# Patient Record
Sex: Female | Born: 1960 | Race: White | Hispanic: No | Marital: Married | Smoking: Former smoker
Health system: Southern US, Community
[De-identification: ages and names within clinical notes are randomized; demographics above are authoritative.]

## PROBLEM LIST (undated history)

## (undated) DIAGNOSIS — N812 Incomplete uterovaginal prolapse: Secondary | ICD-10-CM

## (undated) DIAGNOSIS — K449 Diaphragmatic hernia without obstruction or gangrene: Secondary | ICD-10-CM

## (undated) DIAGNOSIS — J449 Chronic obstructive pulmonary disease, unspecified: Secondary | ICD-10-CM

## (undated) DIAGNOSIS — G43909 Migraine, unspecified, not intractable, without status migrainosus: Secondary | ICD-10-CM

## (undated) DIAGNOSIS — Z973 Presence of spectacles and contact lenses: Secondary | ICD-10-CM

## (undated) DIAGNOSIS — J439 Emphysema, unspecified: Secondary | ICD-10-CM

## (undated) DIAGNOSIS — R3915 Urgency of urination: Secondary | ICD-10-CM

## (undated) DIAGNOSIS — R102 Pelvic and perineal pain: Secondary | ICD-10-CM

## (undated) DIAGNOSIS — R3989 Other symptoms and signs involving the genitourinary system: Secondary | ICD-10-CM

## (undated) DIAGNOSIS — Z87442 Personal history of urinary calculi: Secondary | ICD-10-CM

## (undated) DIAGNOSIS — N393 Stress incontinence (female) (male): Secondary | ICD-10-CM

## (undated) DIAGNOSIS — R350 Frequency of micturition: Secondary | ICD-10-CM

## (undated) DIAGNOSIS — K219 Gastro-esophageal reflux disease without esophagitis: Secondary | ICD-10-CM

## (undated) HISTORY — PX: OTHER SURGICAL HISTORY: SHX169

## (undated) HISTORY — DX: Chronic obstructive pulmonary disease, unspecified: J44.9

---

## 1991-12-09 HISTORY — PX: TUBAL LIGATION: SHX77

## 2002-08-10 ENCOUNTER — Ambulatory Visit (HOSPITAL_COMMUNITY): Admission: RE | Admit: 2002-08-10 | Discharge: 2002-08-10 | Payer: Self-pay | Admitting: Family Medicine

## 2002-08-10 ENCOUNTER — Encounter: Payer: Self-pay | Admitting: Family Medicine

## 2007-02-09 ENCOUNTER — Other Ambulatory Visit: Admission: RE | Admit: 2007-02-09 | Discharge: 2007-02-09 | Payer: Self-pay | Admitting: Family Medicine

## 2008-02-16 ENCOUNTER — Other Ambulatory Visit: Admission: RE | Admit: 2008-02-16 | Discharge: 2008-02-16 | Payer: Self-pay | Admitting: Family Medicine

## 2009-12-11 ENCOUNTER — Other Ambulatory Visit: Admission: RE | Admit: 2009-12-11 | Discharge: 2009-12-11 | Payer: Self-pay | Admitting: Family Medicine

## 2011-02-17 ENCOUNTER — Other Ambulatory Visit (HOSPITAL_COMMUNITY): Payer: Self-pay | Admitting: Family Medicine

## 2011-02-17 DIAGNOSIS — Z1231 Encounter for screening mammogram for malignant neoplasm of breast: Secondary | ICD-10-CM

## 2011-02-27 ENCOUNTER — Ambulatory Visit (HOSPITAL_COMMUNITY)
Admission: RE | Admit: 2011-02-27 | Discharge: 2011-02-27 | Disposition: A | Discharge status: Home / Self Care | Payer: BC Managed Care – PPO | Source: Ambulatory Visit | Attending: Family Medicine | Admitting: Family Medicine

## 2011-02-27 DIAGNOSIS — Z1231 Encounter for screening mammogram for malignant neoplasm of breast: Secondary | ICD-10-CM | POA: Insufficient documentation

## 2011-09-19 ENCOUNTER — Other Ambulatory Visit (HOSPITAL_COMMUNITY)
Admission: RE | Admit: 2011-09-19 | Discharge: 2011-09-19 | Disposition: A | Discharge status: Home / Self Care | Payer: BC Managed Care – PPO | Source: Ambulatory Visit | Attending: Family Medicine | Admitting: Family Medicine

## 2011-09-19 ENCOUNTER — Other Ambulatory Visit: Payer: Self-pay | Admitting: Family Medicine

## 2011-09-19 DIAGNOSIS — Z124 Encounter for screening for malignant neoplasm of cervix: Secondary | ICD-10-CM | POA: Insufficient documentation

## 2012-07-01 ENCOUNTER — Other Ambulatory Visit (HOSPITAL_COMMUNITY): Payer: Self-pay | Admitting: Family Medicine

## 2012-07-01 DIAGNOSIS — Z1231 Encounter for screening mammogram for malignant neoplasm of breast: Secondary | ICD-10-CM

## 2012-07-22 ENCOUNTER — Ambulatory Visit (HOSPITAL_COMMUNITY)
Admission: RE | Admit: 2012-07-22 | Discharge: 2012-07-22 | Disposition: A | Discharge status: Home / Self Care | Payer: BC Managed Care – PPO | Source: Ambulatory Visit | Attending: Family Medicine | Admitting: Family Medicine

## 2012-07-22 DIAGNOSIS — Z1231 Encounter for screening mammogram for malignant neoplasm of breast: Secondary | ICD-10-CM | POA: Insufficient documentation

## 2013-07-11 ENCOUNTER — Other Ambulatory Visit (HOSPITAL_COMMUNITY): Payer: Self-pay | Admitting: Family Medicine

## 2013-07-11 DIAGNOSIS — Z1231 Encounter for screening mammogram for malignant neoplasm of breast: Secondary | ICD-10-CM

## 2013-07-27 ENCOUNTER — Ambulatory Visit (HOSPITAL_COMMUNITY)
Admission: RE | Admit: 2013-07-27 | Discharge: 2013-07-27 | Disposition: A | Discharge status: Home / Self Care | Payer: 59 | Source: Ambulatory Visit | Attending: Family Medicine | Admitting: Family Medicine

## 2013-07-27 DIAGNOSIS — Z1231 Encounter for screening mammogram for malignant neoplasm of breast: Secondary | ICD-10-CM | POA: Insufficient documentation

## 2013-12-23 ENCOUNTER — Ambulatory Visit: Payer: Self-pay | Admitting: Women's Health

## 2014-03-08 DIAGNOSIS — Z9889 Other specified postprocedural states: Secondary | ICD-10-CM

## 2014-03-08 HISTORY — DX: Other specified postprocedural states: Z98.890

## 2014-03-16 ENCOUNTER — Ambulatory Visit (INDEPENDENT_AMBULATORY_CARE_PROVIDER_SITE_OTHER): Payer: 59 | Admitting: Women's Health

## 2014-03-16 ENCOUNTER — Encounter: Payer: Self-pay | Admitting: Women's Health

## 2014-03-16 ENCOUNTER — Telehealth: Payer: Self-pay

## 2014-03-16 VITALS — BP 108/72 | Ht 66.0 in | Wt 152.6 lb

## 2014-03-16 DIAGNOSIS — N952 Postmenopausal atrophic vaginitis: Secondary | ICD-10-CM

## 2014-03-16 DIAGNOSIS — F17200 Nicotine dependence, unspecified, uncomplicated: Secondary | ICD-10-CM

## 2014-03-16 DIAGNOSIS — Z01419 Encounter for gynecological examination (general) (routine) without abnormal findings: Secondary | ICD-10-CM

## 2014-03-16 MED ORDER — ESTROGENS, CONJUGATED 0.625 MG/GM VA CREA: 1.0000 | TOPICAL_CREAM | Freq: Every day | VAGINAL | Status: DC

## 2014-03-16 MED ORDER — ESTROGENS, CONJUGATED 0.625 MG/GM VA CREA: TOPICAL_CREAM | VAGINAL | Status: DC

## 2014-03-16 NOTE — Telephone Encounter (Signed)
Pharmacy notified of correct instructions. Rx corrected in system.

## 2014-03-16 NOTE — Patient Instructions (Signed)
Health Recommendations for Postmenopausal Women Respected and ongoing research has looked at the most common causes of death, disability, and poor quality of life in postmenopausal women. The causes include heart disease, diseases of blood vessels, diabetes, depression, cancer, and bone loss (osteoporosis). Many things can be done to help lower the chances of developing these and other common problems: CARDIOVASCULAR DISEASE Heart Disease: A heart attack is a medical emergency. Know the signs and symptoms of a heart attack. Below are things women can do to reduce their risk for heart disease.   Do not smoke. If you smoke, quit.  Aim for a healthy weight. Being overweight causes many preventable deaths. Eat a healthy and balanced diet and drink an adequate amount of liquids.  Get moving. Make a commitment to be more physically active. Aim for 30 minutes of activity on most, if not all days of the week.  Eat for heart health. Choose a diet that is low in saturated fat and cholesterol and eliminate trans fat. Include whole grains, vegetables, and fruits. Read and understand the labels on food containers before buying.  Know your numbers. Ask your caregiver to check your blood pressure, cholesterol (total, HDL, LDL, triglycerides) and blood glucose. Work with your caregiver on improving your entire clinical picture.  High blood pressure. Limit or stop your table salt intake (try salt substitute and food seasonings). Avoid salty foods and drinks. Read labels on food containers before buying. Eating well and exercising can help control high blood pressure. STROKE  Stroke is a medical emergency. Stroke may be the result of a blood clot in a blood vessel in the brain or by a brain hemorrhage (bleeding). Know the signs and symptoms of a stroke. To lower the risk of developing a stroke:  Avoid fatty foods.  Quit smoking.  Control your diabetes, blood pressure, and irregular heart rate. THROMBOPHLEBITIS  (BLOOD CLOT) OF THE LEG  Becoming overweight and leading a stationary lifestyle may also contribute to developing blood clots. Controlling your diet and exercising will help lower the risk of developing blood clots. CANCER SCREENING  Breast Cancer: Take steps to reduce your risk of breast cancer.  You should practice "breast self-awareness." This means understanding the normal appearance and feel of your breasts and should include breast self-examination. Any changes detected, no matter how small, should be reported to your caregiver.  After age 40, you should have a clinical breast exam (CBE) every year.  Starting at age 40, you should consider having a mammogram (breast X-ray) every year.  If you have a family history of breast cancer, talk to your caregiver about genetic screening.  If you are at high risk for breast cancer, talk to your caregiver about having an MRI and a mammogram every year.  Intestinal or Stomach Cancer: Tests to consider are a rectal exam, fecal occult blood, sigmoidoscopy, and colonoscopy. Women who are high risk may need to be screened at an earlier age and more often.  Cervical Cancer:  Beginning at age 30, you should have a Pap test every 3 years as long as the past 3 Pap tests have been normal.  If you have had past treatment for cervical cancer or a condition that could lead to cancer, you need Pap tests and screening for cancer for at least 20 years after your treatment.  If you had a hysterectomy for a problem that was not cancer or a condition that could lead to cancer, then you no longer need Pap tests.    If you are between ages 65 and 70, and you have had normal Pap tests going back 10 years, you no longer need Pap tests.  If Pap tests have been discontinued, risk factors (such as a new sexual partner) need to be reassessed to determine if screening should be resumed.  Some medical problems can increase the chance of getting cervical cancer. In these  cases, your caregiver may recommend more frequent screening and Pap tests.  Uterine Cancer: If you have vaginal bleeding after reaching menopause, you should notify your caregiver.  Ovarian cancer: Other than yearly pelvic exams, there are no reliable tests available to screen for ovarian cancer at this time except for yearly pelvic exams.  Lung Cancer: Yearly chest X-rays can detect lung cancer and should be done on high risk women, such as cigarette smokers and women with chronic lung disease (emphysema).  Skin Cancer: A complete body skin exam should be done at your yearly examination. Avoid overexposure to the sun and ultraviolet light lamps. Use a strong sun block cream when in the sun. All of these things are important in lowering the risk of skin cancer. MENOPAUSE Menopause Symptoms: Hormone therapy products are effective for treating symptoms associated with menopause:  Moderate to severe hot flashes.  Night sweats.  Mood swings.  Headaches.  Tiredness.  Loss of sex drive.  Insomnia.  Other symptoms. Hormone replacement carries certain risks, especially in older women. Women who use or are thinking about using estrogen or estrogen with progestin treatments should discuss that with their caregiver. Your caregiver will help you understand the benefits and risks. The ideal dose of hormone replacement therapy is not known. The Food and Drug Administration (FDA) has concluded that hormone therapy should be used only at the lowest doses and for the shortest amount of time to reach treatment goals.  OSTEOPOROSIS Protecting Against Bone Loss and Preventing Fracture: If you use hormone therapy for prevention of bone loss (osteoporosis), the risks for bone loss must outweigh the risk of the therapy. Ask your caregiver about other medications known to be safe and effective for preventing bone loss and fractures. To guard against bone loss or fractures, the following is recommended:  If  you are less than age 50, take 1000 mg of calcium and at least 600 mg of Vitamin D per day.  If you are greater than age 50 but less than age 70, take 1200 mg of calcium and at least 600 mg of Vitamin D per day.  If you are greater than age 70, take 1200 mg of calcium and at least 800 mg of Vitamin D per day. Smoking and excessive alcohol intake increases the risk of osteoporosis. Eat foods rich in calcium and vitamin D and do weight bearing exercises several times a week as your caregiver suggests. DIABETES Diabetes Melitus: If you have Type I or Type 2 diabetes, you should keep your blood sugar under control with diet, exercise and recommended medication. Avoid too many sweets, starchy and fatty foods. Being overweight can make control more difficult. COGNITION AND MEMORY Cognition and Memory: Menopausal hormone therapy is not recommended for the prevention of cognitive disorders such as Alzheimer's disease or memory loss.  DEPRESSION  Depression may occur at any age, but is common in elderly women. The reasons may be because of physical, medical, social (loneliness), or financial problems and needs. If you are experiencing depression because of medical problems and control of symptoms, talk to your caregiver about this. Physical activity and   exercise may help with mood and sleep. Community and volunteer involvement may help your sense of value and worth. If you have depression and you feel that the problem is getting worse or becoming severe, talk to your caregiver about treatment options that are best for you. ACCIDENTS  Accidents are common and can be serious in the elderly woman. Prepare your house to prevent accidents. Eliminate throw rugs, place hand bars in the bath, shower and toilet areas. Avoid wearing high heeled shoes or walking on wet, snowy, and icy areas. Limit or stop driving if you have vision or hearing problems, or you feel you are unsteady with you movements and  reflexes. HEPATITIS C Hepatitis C is a type of viral infection affecting the liver. It is spread mainly through contact with blood from an infected person. It can be treated, but if left untreated, it can lead to severe liver damage over years. Many people who are infected do not know that the virus is in their blood. If you are a "baby-boomer", it is recommended that you have one screening test for Hepatitis C. IMMUNIZATIONS  Several immunizations are important to consider having during your senior years, including:   Tetanus, diptheria, and pertussis booster shot.  Influenza every year before the flu season begins.  Pneumonia vaccine.  Shingles vaccine.  Others as indicated based on your specific needs. Talk to your caregiver about these. Document Released: 01/16/2006 Document Revised: 11/10/2012 Document Reviewed: 09/11/2008 Exit[employer] Patient Information 2014 [employer].  

## 2014-03-16 NOTE — Telephone Encounter (Signed)
They received a prescription for Premarin Vag Cream for this patient.  The directions say use daily and there are refills for a year.  Pharmacist is questioning directions as most times she said patient uses it daily for a week or two and then goes to using it 2-3x week. She just wanted to confirm that you want patient to use it daily for a year.

## 2014-03-16 NOTE — Progress Notes (Signed)
Jodi Turner 01-Sep-1961 [id number]    History:    Presents for new patient with problem. Reports urethral pain, pressure for past 3-4 months. Has not been sexually active since pain has started. Has seen a urologist had a negative cystoscopy 02/2014 and was told has probable IC, medication not helping. States has a pressure sensation especially with standing and with activity. No pain or discomfort with sitting or lying. Postmenopausal/no bleeding/no HRT. Normal annual exam at primary care 09/2013. Normal Pap 09/2012. Reports normal Pap and mammogram history. Negative colonoscopy 09/2011.  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  ROS:  A  ROS was performed and pertinent positives and negatives are included.  Exam:  Filed Vitals:   03/16/14 1055  BP: 108/72    General appearance:  Normal Thyroid:  Symmetrical, normal in size, without palpable masses or nodularity. Respiratory  Auscultation:  Clear without wheezing or rhonchi Cardiovascular  Auscultation:  Regular rate, without rubs, murmurs or gallops  Edema/varicosities:  Not grossly evident Abdominal  Soft,nontender, without masses, guarding or rebound.  Liver/spleen:  No organomegaly noted  Hernia:  None appreciated  Skin  Inspection:  Grossly normal   Breasts: Examined lying and sitting.     Right: Without masses, retractions, discharge or axillary adenopathy.     Left: Without masses, retractions, discharge or axillary adenopathy. Gentitourinary   Inguinal/mons:  Normal without inguinal adenopathy  External genitalia:  Normal  BUS/Urethra/Skene's glands:  Normal  Vagina:  Minimal atrophy Cervix:  Normal  Uterus:   normal in size, shape and contour.  Midline and mobile  Adnexa/parametria:     Rt: Without masses or tenderness.   Lt: Without masses or tenderness.  Anus and perineum: Normal  Digital rectal  exam: Normal sphincter tone without palpated masses or tenderness  Assessment/Plan:  53 y.o. MWF G3P3 for annual exam and problem.  Urethral discomfort/pressure Postmenopausal/no HRT/no bleeding Smoker less than half pack daily  Plan: Reviewed option of topical estrogen cream. Reviewed may help lessen discomfort. Premarin vaginal cream externally 3-4 times weekly for the first 2 weeks and then 2-3 times weekly externally. Prescription, coupon given. Aware of hazards of smoking. Risks of blood clots and strokes reviewed. Continue care with urologist. SBE's, continue annual mammogram, 3-D tomography reviewed and encouraged history of dense breast. Regular exercise, calcium rich diet, vitamin D 2000 daily encouraged. DEXA recommended. Has had a recent T.dap.     Huel Cote [facility], 1:29 PM 03/16/2014

## 2014-03-16 NOTE — Telephone Encounter (Signed)
No it should read use 3-4 times per week for first 2 weeks then 2-3 times weekly thereafter. Apologize to the pharmacist I did not look it was in the computer.

## 2014-03-17 LAB — URINALYSIS W MICROSCOPIC + REFLEX CULTURE
BILIRUBIN URINE: NEGATIVE
CRYSTALS: NONE SEEN
Casts: NONE SEEN
Glucose, UA: NEGATIVE mg/dL
KETONES UR: NEGATIVE mg/dL
Leukocytes, UA: NEGATIVE
NITRITE: NEGATIVE
PROTEIN: NEGATIVE mg/dL
SPECIFIC GRAVITY, URINE: 1.02 (ref 1.005–1.030)
UROBILINOGEN UA: 0.2 mg/dL (ref 0.0–1.0)
pH: 7.5 (ref 5.0–8.0)

## 2014-03-18 LAB — URINE CULTURE: Colony Count: 8000

## 2014-03-30 ENCOUNTER — Other Ambulatory Visit: Payer: Self-pay | Admitting: Urology

## 2014-04-04 ENCOUNTER — Encounter (HOSPITAL_BASED_OUTPATIENT_CLINIC_OR_DEPARTMENT_OTHER): Payer: Self-pay | Admitting: *Deleted

## 2014-04-04 NOTE — Progress Notes (Signed)
NPO AFTER MN.  ARRIVE AT 0600.  NEEDS HG.  

## 2014-04-05 NOTE — H&P (Signed)
History of Present Illness   I dictated a long note on Jodi Turner regarding her discomfort near her right urethra worse with standing. Post urinary tract hypersensitivity versus vaginal dryness versus interstitial cystitis has been discussed. Hydrodistention discussed. She tried Advil and then call in and went on regular Mobic. Consult with Dr Amalia Hailey has been discussed in the past. She has minimal stable frequency.   Review of Systems: No change in bowel or neurologic systems.   Her urine cultures have been negative. She has had 4 urinalyses but the last 2 showed mild microscopic hematuria in the face of a negative culture.   She has had a negative cystoscopy. She had a renal ultrasound in January 2015 and she had a 3 mm stone on the right and small cyst on the left.   She has not had a hysterectomy.    Past Medical History Problems  1. History of Anxiety (300.00)  Surgical History Problems  1. History of No Surgical Problems  Current Meds 1. Meloxicam 15 MG Oral Tablet; TAKE ONE TABLET BY MOUTH DAILY AS NEEDED;  Therapy: 01Apr2015 to (Evaluate:31May2015)  Requested for: 01Apr2015; Last  Rx:01Apr2015 Ordered 2. Pyridium 100 MG Oral Tablet; Take 1 table four times per day as needed;  Therapy: 22Jan2015 to (Evaluate:22May2015); Last Rx:22Jan2015 Ordered 3. Wellbutrin XL 150 MG Oral Tablet Extended Release 24 Hour;  Therapy: (Recorded:25Mar2015) to Recorded  Allergies Medication  1. No Known Drug Allergies  Social History Problems  1. Denied: History of Alcohol use 2. Daily caffeine consumption, 2-3 servings a day 3. Death in the family, father   age 4 4. Employed in Glass blower/designer 5. Former smoker (V15.82)   3/4 of a pack per day for 40 yeasrs, quit 3 months ago as of 12/29/13 office visit 6. Married 7. Three children   1 son and 2 daughters  Vitals Vital Signs [Data Includes: Last 1 Day]  Recorded: 23Apr2015 10:21AM  Height:  5 ft 8 in Weight: 155 lb  BMI Calculated: 23.57 BSA Calculated: 1.83 Blood Pressure: 104 / 69, Sitting Temperature: 98.2 F, Oral Heart Rate: 81 Respiration: 16  Results/Data  Urine [Data Includes: Last 1 Day]   23Apr2015  COLOR STRAW   APPEARANCE CLEAR   SPECIFIC GRAVITY <1.005   pH 6.0   GLUCOSE NEG mg/dL  BILIRUBIN NEG   KETONE NEG mg/dL  BLOOD LARGE   PROTEIN NEG mg/dL  UROBILINOGEN 0.2 mg/dL  NITRITE NEG   LEUKOCYTE ESTERASE NEG   SQUAMOUS EPITHELIAL/HPF RARE   WBC 0-2 WBC/hpf  RBC 3-6 RBC/hpf  BACTERIA RARE   CRYSTALS NONE SEEN   CASTS NONE SEEN    Plan Chronic cystitis  1. Follow-up Schedule Surgery Office  Follow-up  Status: Complete  Done: 23Apr2015  Discussion/Summary   Jodi [place] are actually the same. She will feel a lot of frequency if she stands too long, but it is really the discomfort that bothers her the most. She is thinking about seeing Dr Amalia Hailey. I briefly reviewed a hydrodistension again.  I told her that I do not give chronic narcotics. She did see her gynecologist and I encouraged estrogen replacement therapy vaginally 3 times a week for a month and then once weekly and give it a few more weeks. She has only been on it 2 weeks.  We talked about a workup for microscopic hematuria but we decided not to get a CT scan and I think this is a good choice for her.  She would like to proceed with a hydrodistension. I did not give her any antimuscarinics, so this was discussed. She is using Pyridium p.r.n. She has antiinflammatory.  After a thorough review of the management options for the patient's condition the patient  elected to proceed with surgical therapy as noted above. We have discussed the potential benefits and risks of the procedure, side effects of the proposed treatment, the likelihood of the patient achieving the goals of the procedure, and any potential problems that might occur during the procedure or recuperation. Informed consent has  been obtained.

## 2014-04-05 NOTE — Anesthesia Preprocedure Evaluation (Addendum)
Anesthesia Evaluation  Patient identified by MRN, date of birth, ID band Patient awake    Reviewed: Allergy & Precautions, H&P , NPO status , Patient's Chart, lab work & pertinent test results  Airway Mallampati: II TM Distance: >3 FB Neck ROM: Full    Dental no notable dental hx. (+) Dental Advisory Given, Teeth Intact   Pulmonary neg pulmonary ROS, Current Smoker,  breath sounds clear to auscultation  Pulmonary exam normal       Cardiovascular negative cardio ROS  Rhythm:Regular Rate:Normal     Neuro/Psych negative neurological ROS  negative psych ROS   GI/Hepatic negative GI ROS, Neg liver ROS,   Endo/Other  negative endocrine ROS  Renal/GU negative Renal ROS  negative genitourinary   Musculoskeletal negative musculoskeletal ROS (+)   Abdominal   Peds negative pediatric ROS (+)  Hematology negative hematology ROS (+)   Anesthesia Other Findings   Reproductive/Obstetrics negative OB ROS                          Anesthesia Physical Anesthesia Plan  ASA: II  Anesthesia Plan: General   Post-op Pain Management:    Induction: Intravenous  Airway Management Planned: LMA  Additional Equipment:   Intra-op Plan:   Post-operative Plan:   Informed Consent:   Plan Discussed with: Surgeon  Anesthesia Plan Comments:         Anesthesia Quick Evaluation

## 2014-04-06 ENCOUNTER — Encounter (HOSPITAL_BASED_OUTPATIENT_CLINIC_OR_DEPARTMENT_OTHER): Payer: Self-pay

## 2014-04-06 ENCOUNTER — Ambulatory Visit (HOSPITAL_COMMUNITY): Payer: 59

## 2014-04-06 ENCOUNTER — Encounter (HOSPITAL_BASED_OUTPATIENT_CLINIC_OR_DEPARTMENT_OTHER): Payer: 59 | Admitting: Anesthesiology

## 2014-04-06 ENCOUNTER — Ambulatory Visit (HOSPITAL_BASED_OUTPATIENT_CLINIC_OR_DEPARTMENT_OTHER): Payer: 59 | Admitting: Anesthesiology

## 2014-04-06 ENCOUNTER — Encounter (HOSPITAL_BASED_OUTPATIENT_CLINIC_OR_DEPARTMENT_OTHER)
Admission: RE | Disposition: A | Discharge status: Home / Self Care | Payer: Self-pay | Source: Ambulatory Visit | Attending: Urology

## 2014-04-06 ENCOUNTER — Ambulatory Visit (HOSPITAL_BASED_OUTPATIENT_CLINIC_OR_DEPARTMENT_OTHER)
Admission: RE | Admit: 2014-04-06 | Discharge: 2014-04-06 | Disposition: A | Discharge status: Home / Self Care | Payer: 59 | Source: Ambulatory Visit | Attending: Urology | Admitting: Urology

## 2014-04-06 DIAGNOSIS — Z79899 Other long term (current) drug therapy: Secondary | ICD-10-CM | POA: Insufficient documentation

## 2014-04-06 DIAGNOSIS — F411 Generalized anxiety disorder: Secondary | ICD-10-CM | POA: Insufficient documentation

## 2014-04-06 DIAGNOSIS — N201 Calculus of ureter: Secondary | ICD-10-CM | POA: Insufficient documentation

## 2014-04-06 DIAGNOSIS — N2889 Other specified disorders of kidney and ureter: Secondary | ICD-10-CM | POA: Insufficient documentation

## 2014-04-06 DIAGNOSIS — Z87891 Personal history of nicotine dependence: Secondary | ICD-10-CM | POA: Insufficient documentation

## 2014-04-06 HISTORY — DX: Other symptoms and signs involving the genitourinary system: R39.89

## 2014-04-06 HISTORY — PX: CYSTO WITH HYDRODISTENSION: SHX5453

## 2014-04-06 HISTORY — DX: Pelvic and perineal pain: R10.2

## 2014-04-06 HISTORY — DX: Urgency of urination: R39.15

## 2014-04-06 HISTORY — DX: Frequency of micturition: R35.0

## 2014-04-06 HISTORY — PX: CYSTOSCOPY W/ RETROGRADES: SHX1426

## 2014-04-06 HISTORY — PX: CYSTOSCOPY WITH URETHRAL DILATATION: SHX5125

## 2014-04-06 HISTORY — DX: Presence of spectacles and contact lenses: Z97.3

## 2014-04-06 LAB — POCT HEMOGLOBIN-HEMACUE: HEMOGLOBIN: 13.9 g/dL (ref 12.0–15.0)

## 2014-04-06 SURGERY — CYSTOSCOPY, WITH BLADDER HYDRODISTENSION
Anesthesia: General | Site: Urethra | Laterality: Right

## 2014-04-06 MED ORDER — KETOROLAC TROMETHAMINE 30 MG/ML IJ SOLN
INTRAMUSCULAR | Status: DC | PRN
  Administered 2014-04-06: 30 mg via INTRAVENOUS

## 2014-04-06 MED ORDER — STERILE WATER FOR IRRIGATION IR SOLN
Status: DC | PRN
  Administered 2014-04-06: 6000 mL

## 2014-04-06 MED ORDER — IOHEXOL 350 MG/ML SOLN
INTRAVENOUS | Status: DC | PRN
  Administered 2014-04-06: 6 mL via INTRAVENOUS

## 2014-04-06 MED ORDER — ONDANSETRON HCL 4 MG/2ML IJ SOLN
INTRAMUSCULAR | Status: DC | PRN
  Administered 2014-04-06: 4 mg via INTRAVENOUS

## 2014-04-06 MED ORDER — PROPOFOL 10 MG/ML IV BOLUS
INTRAVENOUS | Status: DC | PRN
  Administered 2014-04-06: 150 mg via INTRAVENOUS

## 2014-04-06 MED ORDER — FENTANYL CITRATE 0.05 MG/ML IJ SOLN
25.0000 ug | INTRAMUSCULAR | Status: DC | PRN
  Filled 2014-04-06: qty 1

## 2014-04-06 MED ORDER — CIPROFLOXACIN IN D5W 400 MG/200ML IV SOLN
400.0000 mg | INTRAVENOUS | Status: AC
  Administered 2014-04-06: 400 mg via INTRAVENOUS
  Filled 2014-04-06: qty 200

## 2014-04-06 MED ORDER — CIPROFLOXACIN HCL 250 MG PO TABS: 250.0000 mg | ORAL_TABLET | Freq: Two times a day (BID) | ORAL | Status: DC

## 2014-04-06 MED ORDER — MIDAZOLAM HCL 2 MG/2ML IJ SOLN
INTRAMUSCULAR | Status: AC
  Filled 2014-04-06: qty 2

## 2014-04-06 MED ORDER — ACETAMINOPHEN 10 MG/ML IV SOLN
INTRAVENOUS | Status: DC | PRN
  Administered 2014-04-06: 1000 mg via INTRAVENOUS

## 2014-04-06 MED ORDER — FENTANYL CITRATE 0.05 MG/ML IJ SOLN
INTRAMUSCULAR | Status: AC
  Filled 2014-04-06: qty 4

## 2014-04-06 MED ORDER — LIDOCAINE HCL (CARDIAC) 20 MG/ML IV SOLN
INTRAVENOUS | Status: DC | PRN
  Administered 2014-04-06: 60 mg via INTRAVENOUS

## 2014-04-06 MED ORDER — SODIUM CHLORIDE 0.9 % IR SOLN
Status: DC | PRN
  Administered 2014-04-06: 3000 mL via INTRAVESICAL

## 2014-04-06 MED ORDER — FENTANYL CITRATE 0.05 MG/ML IJ SOLN
INTRAMUSCULAR | Status: DC | PRN
  Administered 2014-04-06 (×2): 50 ug via INTRAVENOUS
  Administered 2014-04-06 (×2): 25 ug via INTRAVENOUS

## 2014-04-06 MED ORDER — DEXAMETHASONE SODIUM PHOSPHATE 4 MG/ML IJ SOLN
INTRAMUSCULAR | Status: DC | PRN
  Administered 2014-04-06: 8 mg via INTRAVENOUS

## 2014-04-06 MED ORDER — LACTATED RINGERS IV SOLN
INTRAVENOUS | Status: DC
  Administered 2014-04-06 (×2): via INTRAVENOUS
  Filled 2014-04-06: qty 1000

## 2014-04-06 MED ORDER — MIDAZOLAM HCL 5 MG/5ML IJ SOLN
INTRAMUSCULAR | Status: DC | PRN
  Administered 2014-04-06: 2 mg via INTRAVENOUS

## 2014-04-06 MED ORDER — LACTATED RINGERS IV SOLN
INTRAVENOUS | Status: DC
  Filled 2014-04-06: qty 1000

## 2014-04-06 SURGICAL SUPPLY — 26 items
BAG DRAIN URO-CYSTO SKYTR STRL (DRAIN) ×4 IMPLANT
BAG DRN UROCATH (DRAIN) ×3
CANISTER SUCT LVC 12 LTR MEDI- (MISCELLANEOUS) ×4 IMPLANT
CATH FOLEY 2WAY SLVR  5CC 18FR (CATHETERS)
CATH FOLEY 2WAY SLVR 5CC 18FR (CATHETERS) IMPLANT
CATH ROBINSON RED A/P 12FR (CATHETERS) ×4 IMPLANT
CATH ROBINSON RED A/P 14FR (CATHETERS) IMPLANT
CATH URET 5FR 28IN OPEN ENDED (CATHETERS) ×4 IMPLANT
CLOTH BEACON ORANGE TIMEOUT ST (SAFETY) ×4 IMPLANT
DRAPE CAMERA CLOSED 9X96 (DRAPES) ×4 IMPLANT
ELECT HF RESECT BIPO 24F 45 ND (CUTTING LOOP) ×4 IMPLANT
ELECT REM PT RETURN 9FT ADLT (ELECTROSURGICAL)
ELECT RESECT VAPORIZE 12D CBL (ELECTRODE) ×4 IMPLANT
ELECTRODE REM PT RTRN 9FT ADLT (ELECTROSURGICAL) IMPLANT
EVACUATOR MICROVAS BLADDER (UROLOGICAL SUPPLIES) ×4 IMPLANT
GLOVE BIO SURGEON STRL SZ7.5 (GLOVE) ×8 IMPLANT
GLOVE BIO SURGEON STRL SZ8 (GLOVE) ×4 IMPLANT
GOWN STRL REUS W/ TWL XL LVL3 (GOWN DISPOSABLE) ×12 IMPLANT
GOWN STRL REUS W/TWL XL LVL3 (GOWN DISPOSABLE) ×20 IMPLANT
GUIDEWIRE STR DUAL SENSOR (WIRE) ×4 IMPLANT
NDL SAFETY ECLIPSE 18X1.5 (NEEDLE) ×3 IMPLANT
NEEDLE HYPO 18GX1.5 SHARP (NEEDLE) ×4
PACK CYSTOSCOPY (CUSTOM PROCEDURE TRAY) ×4 IMPLANT
SUT SILK 0 TIES 10X30 (SUTURE) IMPLANT
SYR 20CC LL (SYRINGE) ×4 IMPLANT
WATER STERILE IRR 3000ML UROMA (IV SOLUTION) ×4 IMPLANT

## 2014-04-06 NOTE — Op Note (Signed)
PATIENT:  Jodi Turner  PRE-OPERATIVE DIAGNOSIS: Rule out IC  POST-OPERATIVE DIAGNOSIS: Right ureterocele with stone  PROCEDURE: 1.  Right retrograde pyelogram with interpretation. 2.  Incision of right ureterocele 3.  Right ureteral stone extraction. 4.  Right ureteroscopy.  SURGEON:  Claybon Jabs  INDICATION: Jodi Turner is a 53 year old female patient who I was asked to assist with on her surgery.  She was having symptoms suggestive of Interstitial cystitis and was brought to the operating room by Dr. Matilde Sprang for further evaluation of this condition.  During her surgery he found what appeared to be a stone located at the right ureteral orifice associated with a right ureterocele and contacted me for assistance with management.  Dr. Matilde Sprang spoke with the family about his intraoperative findings and about contacting me for assistance with the case.  ANESTHESIA:  General  EBL:  Minimal  DRAINS: None  LOCAL MEDICATIONS USED:  None  SPECIMEN: Stone given the patient.   Description of procedure: The patient was seen in the operating room under general anesthesia in the dorsal lithotomy position.  A 23 French cystoscope was introduced into the bladder and I was able to identify the right ureteral orifice and pass a 0.038 inch floppy tip sensor guidewire through the ureteral orifice and up the right ureter under fluoroscopic control.  I then passed a 5 [place] open-ended catheter over the guidewire into the upper ureter and left this in place and removed the guidewire.  A right retrograde pyelogram was then performed by injecting full strength contrast under direct fluoroscopy through the open-ended catheter and into the right ureter.  I noted the ureter to be entirely normal without evidence of hydronephrosis and watched the contrast as it filled the lower ureter and filled the ureterocele with an associated filling defect consistent with the stone.  I left the  open-ended catheter in place.  I then removed the cystoscope.  The 28 French resectoscope with visual obturator was then introduced in the bladder and obturator removed and replaced with a [employer].  I cut over the middle of the ureterocele which corresponded to the location of the open-ended catheter and started at the ureteral orifice and cut back into the midportion of the ureterocele.  This opened up the ureterocele nicely.  A single bleeding point was controlled with point cautery.  I then removed the resectoscope and inserted the cystoscope again and using the alligator forceps was able to grasp the stone and remove it from the ureterocele and then out through the urethra.  I then reinserted the cystoscope and evaluated the incised ureterocele.  No further stones were noted within ureterocele and I was able to pass the cystoscope into the ureterocele and up the right ureter for a short distance noting no abnormality or further stones.There was no bleeding.  Since the ureterocele was widely open I did not feel there was a need for a stent.  I then turned the case back over to Dr. Matilde Sprang.

## 2014-04-06 NOTE — Interval H&P Note (Signed)
History and Physical Interval Note:  04/06/2014 7:17 AM  Jodi Turner  has presented today for surgery, with the diagnosis of PELVIC  PAIN  The various methods of treatment have been discussed with the patient and family. After consideration of risks, benefits and other options for treatment, the patient has consented to  Procedure(s): CYSTOSCOPY/HYDRODISTENSION INSTILLATION OF MARCAINE AND PYRIDIUM (N/A) as a surgical intervention .  The patient's history has been reviewed, patient examined, no change in status, stable for surgery.  I have reviewed the patient's chart and labs.  Questions were answered to the patient's satisfaction.     [name] A Rana Hochstein

## 2014-04-06 NOTE — Anesthesia Postprocedure Evaluation (Signed)
  Anesthesia Post-op Note  Patient: Jodi Turner  Procedure(s) Performed: Procedure(s) (LRB): CYSTOSCOPY/HYDRODISTENSION OF BLADDER (N/A) CYSTOSCOPY WITH URETHRAL DILATATION (N/A) CYSTOSCOPY WITH RETROGRADE PYELOGRAM, INCISION OF RIGHT URETEROCELE, REMOVAL OF RIGHT DISTAL STONE (Right)  Patient Location: PACU  Anesthesia Type: General  Level of Consciousness: awake and alert   Airway and Oxygen Therapy: Patient Spontanous Breathing  Post-op Pain: mild  Post-op Assessment: Post-op Vital signs reviewed, Patient's Cardiovascular Status Stable, Respiratory Function Stable, Patent Airway and No signs of Nausea or vomiting  Last Vitals:  Filed Vitals:   04/06/14 0900  BP: 124/75  Pulse: 69  Temp:   Resp: 16    Post-op Vital Signs: stable   Complications: No apparent anesthesia complications

## 2014-04-06 NOTE — Anesthesia Procedure Notes (Signed)
Procedure Name: LMA Insertion Date/Time: 04/06/2014 7:33 AM Performed by: Mechele Claude Pre-anesthesia Checklist: Patient identified, Emergency Drugs available, Suction available and Patient being monitored Patient Re-evaluated:Patient Re-evaluated prior to inductionOxygen Delivery Method: Circle System Utilized Preoxygenation: Pre-oxygenation with 100% oxygen Intubation Type: IV induction Ventilation: Mask ventilation without difficulty LMA: LMA inserted LMA Size: 4.0 Number of attempts: 1 Airway Equipment and Method: bite block Placement Confirmation: positive ETCO2 Tube secured with: Tape Dental Injury: Teeth and Oropharynx as per pre-operative assessment

## 2014-04-06 NOTE — Op Note (Signed)
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 Bjorn Loser  The patient has the above diagnoses and consented above procedure. Her urethral discomfort was worsening over time. She had no lateralizing symptoms. Preoperative antibiotics were given  When I first cystoscoped the patient a 26 [place] scope after careful leg positioning the bladder mucosa was normal. She had edema of the trigone especially the right side. She had an obvious ureterocele about the size of my fist fingertip were larger with a crowning stone. At this point I asked for Dr. Simone Curia assistance  A drop length did the above case and dictated separately.  Because the patient was under anesthesia was still best to do a hydrodistention to 500 mL. On reinspection the bladder there were no glomerulations. Recognizing in limitations a still dilated urethra to 51 [place] utilizing sterile technique from 44 [place] as planned preoperatively.  I am hopeful that the distal stone was radiating discomfort to the urethra and this procedure will prove curative  I did irrigate small fragments from the bladder at the end of the case though these were not seen on the retrograde. They likely had flaked off from the main stone that was removed with grasping forcep  Dr. Karsten Ro I both agreed not to place a stent. I did not place a Foley catheter. A prolonged course of antibiotics would be prudent

## 2014-04-06 NOTE — Discharge Instructions (Signed)
I have reviewed discharge instructions in detail with the patient. They will follow-up with me or their physician as scheduled. My nurse will also be calling the patients as per protocol. ° ° ° CYSTOSCOPY HOME CARE INSTRUCTIONS ° °Activity: °Rest for the remainder of the day.  Do not drive or operate equipment today.  You may resume normal activities in one to two days as instructed by your physician.  ° °Meals: °Drink plenty of liquids and eat light foods such as gelatin or soup this evening.  You may return to a normal meal plan tomorrow. ° °Return to Work: °You may return to work in one to two days or as instructed by your physician. ° °Special Instructions / Symptoms: °Call your physician if any of these symptoms occur: ° ° -persistent or heavy bleeding ° -bleeding which continues after first few urination ° -large blood clots that are difficult to pass ° -urine stream diminishes or stops completely ° -fever equal to or higher than 101 degrees Farenheit. ° -cloudy urine with a strong, foul odor ° -severe pain ° °Females should always wipe from front to back after elimination.  You may feel some burning pain when you urinate.  This should disappear with time.  Applying moist heat to the lower abdomen or a hot tub bath may help relieve the pain. \ ° °Follow-Up / Date of Return Visit to Your Physician:  °Call for an appointment to arrange follow-up. ° °Patient Signature:  ________________________________________________________ ° °Nurse's Signature:  ________________________________________________________ ° °Post Anesthesia Home Care Instructions ° °Activity: °Get plenty of rest for the remainder of the day. A responsible adult should stay with you for 24 hours following the procedure.  °For the next 24 hours, DO NOT: °-Drive a car °-Operate machinery °-Drink alcoholic beverages °-Take any medication unless instructed by your physician °-Make any legal decisions or sign important papers. ° °Meals: °Start with  liquid foods such as gelatin or soup. Progress to regular foods as tolerated. Avoid greasy, spicy, heavy foods. If nausea and/or vomiting occur, drink only clear liquids until the nausea and/or vomiting subsides. Call your physician if vomiting continues. ° °Special Instructions/Symptoms: °Your throat may feel dry or sore from the anesthesia or the breathing tube placed in your throat during surgery. If this causes discomfort, gargle with warm salt water. The discomfort should disappear within 24 hours. ° °

## 2014-04-06 NOTE — Transfer of Care (Signed)
Immediate Anesthesia Transfer of Care Note  Patient: Jodi Turner  Procedure(s) Performed: Procedure(s) (LRB): CYSTOSCOPY/HYDRODISTENSION OF BLADDER (N/A) CYSTOSCOPY WITH URETHRAL DILATATION (N/A) CYSTOSCOPY WITH RETROGRADE PYELOGRAM, INCISION OF RIGHT URETEROCELE, REMOVAL OF RIGHT DISTAL STONE (Right)  Patient Location: PACU  Anesthesia Type: General  Level of Consciousness: awake, alert  and oriented  Airway & Oxygen Therapy: Patient Spontanous Breathing and Patient connected to face mask oxygen  Post-op Assessment: Report given to PACU RN and Post -op Vital signs reviewed and stable  Post vital signs: Reviewed and stable  Complications: No apparent anesthesia complications

## 2014-04-10 ENCOUNTER — Encounter (HOSPITAL_BASED_OUTPATIENT_CLINIC_OR_DEPARTMENT_OTHER): Payer: Self-pay | Admitting: Urology

## 2014-07-06 ENCOUNTER — Other Ambulatory Visit (HOSPITAL_COMMUNITY): Payer: Self-pay | Admitting: Family Medicine

## 2014-07-06 DIAGNOSIS — Z1231 Encounter for screening mammogram for malignant neoplasm of breast: Secondary | ICD-10-CM

## 2014-07-31 ENCOUNTER — Ambulatory Visit (HOSPITAL_COMMUNITY)
Admission: RE | Admit: 2014-07-31 | Discharge: 2014-07-31 | Disposition: A | Discharge status: Home / Self Care | Payer: 59 | Source: Ambulatory Visit | Attending: Family Medicine | Admitting: Family Medicine

## 2014-07-31 DIAGNOSIS — Z1231 Encounter for screening mammogram for malignant neoplasm of breast: Secondary | ICD-10-CM | POA: Diagnosis not present

## 2014-09-29 ENCOUNTER — Other Ambulatory Visit (HOSPITAL_COMMUNITY)
Admission: RE | Admit: 2014-09-29 | Discharge: 2014-09-29 | Disposition: A | Discharge status: Home / Self Care | Payer: 59 | Source: Ambulatory Visit | Attending: Family Medicine | Admitting: Family Medicine

## 2014-09-29 ENCOUNTER — Other Ambulatory Visit: Payer: Self-pay | Admitting: Family Medicine

## 2014-09-29 DIAGNOSIS — Z1151 Encounter for screening for human papillomavirus (HPV): Secondary | ICD-10-CM | POA: Insufficient documentation

## 2014-09-29 DIAGNOSIS — Z124 Encounter for screening for malignant neoplasm of cervix: Secondary | ICD-10-CM | POA: Diagnosis not present

## 2014-10-02 LAB — CYTOLOGY - PAP

## 2015-07-09 ENCOUNTER — Other Ambulatory Visit (HOSPITAL_COMMUNITY): Payer: Self-pay | Admitting: Family Medicine

## 2015-07-09 DIAGNOSIS — Z1231 Encounter for screening mammogram for malignant neoplasm of breast: Secondary | ICD-10-CM

## 2015-08-06 ENCOUNTER — Ambulatory Visit (HOSPITAL_COMMUNITY)
Admission: RE | Admit: 2015-08-06 | Discharge: 2015-08-06 | Disposition: A | Discharge status: Home / Self Care | Payer: 59 | Source: Ambulatory Visit | Attending: Family Medicine | Admitting: Family Medicine

## 2015-08-06 DIAGNOSIS — Z1231 Encounter for screening mammogram for malignant neoplasm of breast: Secondary | ICD-10-CM | POA: Diagnosis present

## 2016-07-09 ENCOUNTER — Other Ambulatory Visit: Payer: Self-pay | Admitting: Family Medicine

## 2016-07-09 DIAGNOSIS — Z1231 Encounter for screening mammogram for malignant neoplasm of breast: Secondary | ICD-10-CM

## 2016-08-07 ENCOUNTER — Ambulatory Visit
Admission: RE | Admit: 2016-08-07 | Discharge: 2016-08-07 | Disposition: A | Discharge status: Home / Self Care | Payer: 59 | Source: Ambulatory Visit | Attending: Family Medicine | Admitting: Family Medicine

## 2016-08-07 DIAGNOSIS — Z1231 Encounter for screening mammogram for malignant neoplasm of breast: Secondary | ICD-10-CM

## 2017-01-01 DIAGNOSIS — J0100 Acute maxillary sinusitis, unspecified: Secondary | ICD-10-CM | POA: Diagnosis not present

## 2017-04-08 DIAGNOSIS — R1310 Dysphagia, unspecified: Secondary | ICD-10-CM | POA: Diagnosis not present

## 2017-04-08 DIAGNOSIS — R1013 Epigastric pain: Secondary | ICD-10-CM | POA: Diagnosis not present

## 2017-04-09 ENCOUNTER — Other Ambulatory Visit: Payer: Self-pay | Admitting: Family Medicine

## 2017-04-09 DIAGNOSIS — R1310 Dysphagia, unspecified: Secondary | ICD-10-CM

## 2017-04-09 DIAGNOSIS — R1319 Other dysphagia: Secondary | ICD-10-CM

## 2017-04-14 ENCOUNTER — Ambulatory Visit
Admission: RE | Admit: 2017-04-14 | Discharge: 2017-04-14 | Disposition: A | Discharge status: Home / Self Care | Payer: 59 | Source: Ambulatory Visit | Attending: Family Medicine | Admitting: Family Medicine

## 2017-04-14 DIAGNOSIS — K219 Gastro-esophageal reflux disease without esophagitis: Secondary | ICD-10-CM | POA: Diagnosis not present

## 2017-04-14 DIAGNOSIS — R10816 Epigastric abdominal tenderness: Secondary | ICD-10-CM | POA: Diagnosis not present

## 2017-04-14 DIAGNOSIS — K224 Dyskinesia of esophagus: Secondary | ICD-10-CM | POA: Diagnosis not present

## 2017-04-14 DIAGNOSIS — R1319 Other dysphagia: Secondary | ICD-10-CM

## 2017-04-14 DIAGNOSIS — R1310 Dysphagia, unspecified: Secondary | ICD-10-CM

## 2017-04-21 ENCOUNTER — Other Ambulatory Visit: Payer: Self-pay | Admitting: Family Medicine

## 2017-04-21 DIAGNOSIS — R079 Chest pain, unspecified: Secondary | ICD-10-CM

## 2017-04-21 DIAGNOSIS — Z720 Tobacco use: Secondary | ICD-10-CM

## 2017-04-23 ENCOUNTER — Ambulatory Visit
Admission: RE | Admit: 2017-04-23 | Discharge: 2017-04-23 | Disposition: A | Discharge status: Home / Self Care | Payer: 59 | Source: Ambulatory Visit | Attending: Family Medicine | Admitting: Family Medicine

## 2017-04-23 DIAGNOSIS — R918 Other nonspecific abnormal finding of lung field: Secondary | ICD-10-CM | POA: Diagnosis not present

## 2017-04-23 DIAGNOSIS — R079 Chest pain, unspecified: Secondary | ICD-10-CM

## 2017-04-23 DIAGNOSIS — Z720 Tobacco use: Secondary | ICD-10-CM

## 2017-05-01 DIAGNOSIS — N819 Female genital prolapse, unspecified: Secondary | ICD-10-CM | POA: Diagnosis not present

## 2017-05-08 DIAGNOSIS — N9489 Other specified conditions associated with female genital organs and menstrual cycle: Secondary | ICD-10-CM | POA: Diagnosis not present

## 2017-05-08 DIAGNOSIS — D3131 Benign neoplasm of right choroid: Secondary | ICD-10-CM | POA: Diagnosis not present

## 2017-08-06 ENCOUNTER — Other Ambulatory Visit: Payer: Self-pay | Admitting: Family Medicine

## 2017-08-06 DIAGNOSIS — Z1231 Encounter for screening mammogram for malignant neoplasm of breast: Secondary | ICD-10-CM

## 2017-08-21 ENCOUNTER — Ambulatory Visit
Admission: RE | Admit: 2017-08-21 | Discharge: 2017-08-21 | Disposition: A | Discharge status: Home / Self Care | Payer: 59 | Source: Ambulatory Visit | Attending: Family Medicine | Admitting: Family Medicine

## 2017-08-21 DIAGNOSIS — Z1231 Encounter for screening mammogram for malignant neoplasm of breast: Secondary | ICD-10-CM

## 2018-04-23 ENCOUNTER — Other Ambulatory Visit: Payer: Self-pay

## 2018-04-23 ENCOUNTER — Encounter: Payer: Self-pay | Admitting: Physician Assistant

## 2018-04-23 ENCOUNTER — Ambulatory Visit (INDEPENDENT_AMBULATORY_CARE_PROVIDER_SITE_OTHER): Payer: 59 | Admitting: Physician Assistant

## 2018-04-23 VITALS — BP 118/88 | HR 84 | Temp 99.0°F | Resp 16 | Ht 68.0 in | Wt 140.8 lb

## 2018-04-23 DIAGNOSIS — J0100 Acute maxillary sinusitis, unspecified: Secondary | ICD-10-CM

## 2018-04-23 MED ORDER — AMOXICILLIN 875 MG PO TABS: 875.0000 mg | ORAL_TABLET | Freq: Two times a day (BID) | ORAL | 0 refills | Status: DC

## 2018-04-23 MED ORDER — GUAIFENESIN ER 1200 MG PO TB12: 1.0000 | ORAL_TABLET | Freq: Two times a day (BID) | ORAL | 0 refills | Status: AC

## 2018-04-23 NOTE — Progress Notes (Signed)
Jodi Turner  MRN: [id number] DOB: [birth year]  PCP: Maurice Small, MD  Chief Complaint  Patient presents with  . Sinusitis    sinus pressure, teeth pain, yellow mucus, started in March went away then came back 10 days ago     Subjective:  Pt presents to clinic for concerns that she may have a sinus infection.  She has symptoms of sinus pressure and congestion back in March 2 used Nettie pot and felt like her symptoms got better until about 10 or 12 days ago started having worsening sinus pressure and pain mainly in the maxillary area mainly on the right side.  She continues to use the Nettie pot but she is just getting clear stuff at this point.  She is having teeth pain sinus pressure.  She is not having any type of dizziness.  She is having postnasal drip which is irritating her throat causing her to cough but not having any shortness of breath or wheezing.  She has no knowledge of having seasonal allergies but wonders this as she is gotten older she started to developing some sensitivity to season change.  History is obtained by patient.  Review of Systems  Constitutional: Negative for chills and fever.  HENT: Positive for congestion, postnasal drip and rhinorrhea (yellow but now clear). Negative for sore throat.   Respiratory: Negative for cough, shortness of breath and wheezing.   Allergic/Immunologic: Negative for environmental allergies.    Patient Active Problem List   Diagnosis Date Noted  . Smoker 03/16/2014    No current outpatient medications on file prior to visit.   No current facility-administered medications on file prior to visit.     No Known Allergies  Past Medical History:  Diagnosis Date  . Frequency of urination   . Pelvic pain   . Sensation of pressure in bladder area   . Urgency of urination   . Wears contact lenses    Social History   Social History Narrative  . Not on file   Social History   Tobacco Use  . Smoking status: Current  Every Day Smoker    Packs/day: 0.25    Years: 40.00    Pack years: 10.00    Types: Cigarettes  . Smokeless tobacco: Never Used  Substance Use Topics  . Alcohol use: No  . Drug use: No   family history includes Hypertension in her father.     Objective:  BP 118/88   Pulse 84   Temp 99 F (37.2 C)   Resp 16   Ht 5\' 8"  (1.727 m)   Wt 140 lb 12.8 oz (63.9 kg)   SpO2 97%   BMI 21.41 kg/m  Body mass index is 21.41 kg/m.  Physical Exam  Constitutional: She is oriented to person, place, and time. She appears well-developed and well-nourished.  HENT:  Head: Normocephalic and atraumatic.  Right Ear: Hearing, tympanic membrane, external ear and ear canal normal.  Left Ear: Hearing, tympanic membrane, external ear and ear canal normal.  Nose: Right sinus exhibits maxillary sinus tenderness. Right sinus exhibits no frontal sinus tenderness. Left sinus exhibits no maxillary sinus tenderness and no frontal sinus tenderness.  Mouth/Throat: Uvula is midline, oropharynx is clear and moist and mucous membranes are normal.  Eyes: Conjunctivae are normal.  Neck: Normal range of motion.  Cardiovascular: Normal rate, regular rhythm and normal heart sounds.  No murmur heard. Pulmonary/Chest: Effort normal and breath sounds normal.  Lymphadenopathy:       Head (  right side): No tonsillar, no preauricular, no posterior auricular and no occipital adenopathy present.       Head (left side): No tonsillar, no preauricular, no posterior auricular and no occipital adenopathy present.    She has no cervical adenopathy.       Right: No supraclavicular adenopathy present.       Left: No supraclavicular adenopathy present.  Neurological: She is alert and oriented to person, place, and time.  Skin: Skin is warm and dry.  Psychiatric: She has a normal mood and affect. Her behavior is normal. Judgment and thought content normal.  Vitals reviewed.   Assessment and Plan :  Acute non-recurrent maxillary  sinusitis - Plan: amoxicillin (AMOXIL) 875 MG tablet, Guaifenesin (MUCINEX MAXIMUM STRENGTH) 1200 MG TB12  Patient will increase fluids to help the Mucinex works better and she will continue using her Nettie pot.  She will use all of the above medications.  She has had problems in the past with yeast infections and will let me know if she has that problem this time.  Windell Hummingbird PA-C  [facility] 04/23/2018 9:09 AM

## 2018-04-23 NOTE — Patient Instructions (Signed)
     IF you received an x-ray today, you will receive an invoice from [facility]. Please contact [facility] at [phone] with questions or concerns regarding your invoice.   IF you received labwork today, you will receive an invoice from [facility]. Please contact [facility] at [phone] with questions or concerns regarding your invoice.   Our billing staff will not be able to assist you with questions regarding bills from these companies.  You will be contacted with the lab results as soon as they are available. The fastest way to get your results is to activate your My Chart account. Instructions are located on the last page of this paperwork. If you have not heard from us regarding the results in 2 weeks, please contact this office.     

## 2018-04-28 ENCOUNTER — Other Ambulatory Visit: Payer: Self-pay | Admitting: Family Medicine

## 2018-04-28 ENCOUNTER — Other Ambulatory Visit (HOSPITAL_COMMUNITY): Payer: Self-pay | Admitting: Family Medicine

## 2018-04-28 DIAGNOSIS — R911 Solitary pulmonary nodule: Secondary | ICD-10-CM

## 2018-05-10 ENCOUNTER — Ambulatory Visit
Admission: RE | Admit: 2018-05-10 | Discharge: 2018-05-10 | Disposition: A | Discharge status: Home / Self Care | Payer: 59 | Source: Ambulatory Visit | Attending: Family Medicine | Admitting: Family Medicine

## 2018-05-10 DIAGNOSIS — R918 Other nonspecific abnormal finding of lung field: Secondary | ICD-10-CM | POA: Diagnosis not present

## 2018-05-10 DIAGNOSIS — R911 Solitary pulmonary nodule: Secondary | ICD-10-CM

## 2018-05-31 ENCOUNTER — Other Ambulatory Visit: Payer: Self-pay | Admitting: Family Medicine

## 2018-05-31 ENCOUNTER — Other Ambulatory Visit (HOSPITAL_COMMUNITY)
Admission: RE | Admit: 2018-05-31 | Discharge: 2018-05-31 | Disposition: A | Discharge status: Home / Self Care | Payer: 59 | Source: Ambulatory Visit | Attending: Family Medicine | Admitting: Family Medicine

## 2018-05-31 DIAGNOSIS — Z0000 Encounter for general adult medical examination without abnormal findings: Secondary | ICD-10-CM | POA: Diagnosis not present

## 2018-05-31 DIAGNOSIS — Z01411 Encounter for gynecological examination (general) (routine) with abnormal findings: Secondary | ICD-10-CM | POA: Diagnosis not present

## 2018-05-31 DIAGNOSIS — Z124 Encounter for screening for malignant neoplasm of cervix: Secondary | ICD-10-CM | POA: Diagnosis not present

## 2018-05-31 DIAGNOSIS — E7800 Pure hypercholesterolemia, unspecified: Secondary | ICD-10-CM | POA: Diagnosis not present

## 2018-06-01 LAB — CYTOLOGY - PAP
Diagnosis: NEGATIVE
HPV (WINDOPATH): NOT DETECTED

## 2018-07-15 ENCOUNTER — Other Ambulatory Visit: Payer: Self-pay | Admitting: Family Medicine

## 2018-07-15 DIAGNOSIS — Z1231 Encounter for screening mammogram for malignant neoplasm of breast: Secondary | ICD-10-CM

## 2018-08-02 DIAGNOSIS — D3131 Benign neoplasm of right choroid: Secondary | ICD-10-CM | POA: Diagnosis not present

## 2018-08-31 ENCOUNTER — Ambulatory Visit
Admission: RE | Admit: 2018-08-31 | Discharge: 2018-08-31 | Disposition: A | Discharge status: Home / Self Care | Payer: BLUE CROSS/BLUE SHIELD | Source: Ambulatory Visit | Attending: Family Medicine | Admitting: Family Medicine

## 2018-08-31 DIAGNOSIS — Z1231 Encounter for screening mammogram for malignant neoplasm of breast: Secondary | ICD-10-CM | POA: Diagnosis not present

## 2019-06-15 DIAGNOSIS — E7800 Pure hypercholesterolemia, unspecified: Secondary | ICD-10-CM | POA: Diagnosis not present

## 2019-06-15 DIAGNOSIS — Z0000 Encounter for general adult medical examination without abnormal findings: Secondary | ICD-10-CM | POA: Diagnosis not present

## 2019-06-15 DIAGNOSIS — Z5181 Encounter for therapeutic drug level monitoring: Secondary | ICD-10-CM | POA: Diagnosis not present

## 2019-08-18 ENCOUNTER — Other Ambulatory Visit: Payer: Self-pay | Admitting: Family Medicine

## 2019-08-18 DIAGNOSIS — Z1231 Encounter for screening mammogram for malignant neoplasm of breast: Secondary | ICD-10-CM

## 2019-10-04 ENCOUNTER — Other Ambulatory Visit: Payer: Self-pay

## 2019-10-04 ENCOUNTER — Ambulatory Visit
Admission: RE | Admit: 2019-10-04 | Discharge: 2019-10-04 | Disposition: A | Discharge status: Home / Self Care | Payer: 59 | Source: Ambulatory Visit | Attending: Family Medicine | Admitting: Family Medicine

## 2019-10-04 DIAGNOSIS — Z1231 Encounter for screening mammogram for malignant neoplasm of breast: Secondary | ICD-10-CM

## 2019-10-06 ENCOUNTER — Other Ambulatory Visit: Payer: Self-pay | Admitting: Family Medicine

## 2019-10-06 DIAGNOSIS — R928 Other abnormal and inconclusive findings on diagnostic imaging of breast: Secondary | ICD-10-CM

## 2019-10-10 ENCOUNTER — Other Ambulatory Visit: Payer: Self-pay

## 2019-10-10 ENCOUNTER — Other Ambulatory Visit: Payer: Self-pay | Admitting: Family Medicine

## 2019-10-10 ENCOUNTER — Ambulatory Visit: Payer: 59

## 2019-10-10 ENCOUNTER — Ambulatory Visit
Admission: RE | Admit: 2019-10-10 | Discharge: 2019-10-10 | Disposition: A | Discharge status: Home / Self Care | Payer: 59 | Source: Ambulatory Visit | Attending: Family Medicine | Admitting: Family Medicine

## 2019-10-10 DIAGNOSIS — R928 Other abnormal and inconclusive findings on diagnostic imaging of breast: Secondary | ICD-10-CM

## 2020-09-26 ENCOUNTER — Other Ambulatory Visit: Payer: Self-pay | Admitting: Family Medicine

## 2020-09-26 DIAGNOSIS — Z1231 Encounter for screening mammogram for malignant neoplasm of breast: Secondary | ICD-10-CM

## 2020-11-22 ENCOUNTER — Other Ambulatory Visit: Payer: Self-pay

## 2020-11-22 ENCOUNTER — Ambulatory Visit
Admission: RE | Admit: 2020-11-22 | Discharge: 2020-11-22 | Disposition: A | Discharge status: Home / Self Care | Payer: BC Managed Care – PPO | Source: Ambulatory Visit | Attending: Family Medicine | Admitting: Family Medicine

## 2020-11-22 DIAGNOSIS — Z1231 Encounter for screening mammogram for malignant neoplasm of breast: Secondary | ICD-10-CM

## 2021-11-14 ENCOUNTER — Other Ambulatory Visit (HOSPITAL_BASED_OUTPATIENT_CLINIC_OR_DEPARTMENT_OTHER): Payer: Self-pay

## 2021-11-14 ENCOUNTER — Ambulatory Visit: Payer: BC Managed Care – PPO | Attending: Internal Medicine

## 2021-11-14 DIAGNOSIS — Z23 Encounter for immunization: Secondary | ICD-10-CM

## 2021-11-14 MED ORDER — PFIZER COVID-19 VAC BIVALENT 30 MCG/0.3ML IM SUSP
INTRAMUSCULAR | 0 refills | Status: DC
  Filled 2021-11-14: qty 0.3, 1d supply, fill #0

## 2021-11-14 NOTE — Progress Notes (Signed)
   Covid-19 Vaccination Clinic  Name:  Jodi Turner    MRN: [id number] DOB: 12-May-1961  11/14/2021  [name] was observed post Covid-19 immunization for 15 minutes without incident. She was provided with Vaccine Information Sheet and instruction to access the V-Safe system.   [name] was instructed to call 911 with any severe reactions post vaccine: Difficulty breathing  Swelling of face and throat  A fast heartbeat  A bad rash all over body  Dizziness and weakness   Immunizations Administered     Name Date Dose VIS Date Route   Pfizer Covid-19 Vaccine Bivalent Booster 11/14/2021 11:58 AM 0.3 mL 08/07/2021 Intramuscular   Manufacturer: [place]   Lot: [id number]   [place]: [phone]

## 2021-11-28 ENCOUNTER — Other Ambulatory Visit (HOSPITAL_BASED_OUTPATIENT_CLINIC_OR_DEPARTMENT_OTHER): Payer: Self-pay

## 2021-11-28 MED ORDER — INFLUENZA VAC SPLIT QUAD 0.5 ML IM SUSY
PREFILLED_SYRINGE | INTRAMUSCULAR | 0 refills | Status: DC
  Filled 2021-11-28: qty 0.5, 1d supply, fill #0

## 2021-12-06 ENCOUNTER — Other Ambulatory Visit (HOSPITAL_BASED_OUTPATIENT_CLINIC_OR_DEPARTMENT_OTHER): Payer: Self-pay

## 2022-02-13 ENCOUNTER — Other Ambulatory Visit: Payer: Self-pay | Admitting: Family Medicine

## 2022-05-02 ENCOUNTER — Other Ambulatory Visit: Payer: Self-pay | Admitting: Family Medicine

## 2022-05-02 DIAGNOSIS — E7800 Pure hypercholesterolemia, unspecified: Secondary | ICD-10-CM

## 2022-05-07 ENCOUNTER — Ambulatory Visit: Payer: BC Managed Care – PPO | Attending: Internal Medicine

## 2022-05-07 ENCOUNTER — Other Ambulatory Visit (HOSPITAL_BASED_OUTPATIENT_CLINIC_OR_DEPARTMENT_OTHER): Payer: Self-pay

## 2022-05-07 DIAGNOSIS — Z23 Encounter for immunization: Secondary | ICD-10-CM

## 2022-05-07 MED ORDER — PFIZER COVID-19 VAC BIVALENT 30 MCG/0.3ML IM SUSP
INTRAMUSCULAR | 0 refills | Status: DC
  Filled 2022-05-07: qty 0.3, 1d supply, fill #0

## 2022-05-07 NOTE — Progress Notes (Signed)
   Covid-19 Vaccination Clinic  Name:  Jodi Turner    MRN: [id number] DOB: 11-01-1961  05/07/2022  [name] was observed post Covid-19 immunization for 15 minutes without incident. She was provided with Vaccine Information Sheet and instruction to access the V-Safe system.   [name] was instructed to call 911 with any severe reactions post vaccine: Difficulty breathing  Swelling of face and throat  A fast heartbeat  A bad rash all over body  Dizziness and weakness   Immunizations Administered     Name Date Dose VIS Date Route   Pfizer Covid-19 Vaccine Bivalent Booster 05/07/2022  2:20 PM 0.3 mL 08/07/2021 Intramuscular   Manufacturer: [place]   Lot: [id number]   [place]: [id number]

## 2022-05-14 ENCOUNTER — Other Ambulatory Visit: Payer: Self-pay | Admitting: *Deleted

## 2022-05-14 ENCOUNTER — Ambulatory Visit
Admission: RE | Admit: 2022-05-14 | Discharge: 2022-05-14 | Disposition: A | Discharge status: Home / Self Care | Payer: No Typology Code available for payment source | Source: Ambulatory Visit | Attending: Family Medicine | Admitting: Family Medicine

## 2022-05-14 DIAGNOSIS — Z122 Encounter for screening for malignant neoplasm of respiratory organs: Secondary | ICD-10-CM

## 2022-05-14 DIAGNOSIS — E7800 Pure hypercholesterolemia, unspecified: Secondary | ICD-10-CM

## 2022-05-14 DIAGNOSIS — Z87891 Personal history of nicotine dependence: Secondary | ICD-10-CM

## 2022-06-11 ENCOUNTER — Ambulatory Visit (INDEPENDENT_AMBULATORY_CARE_PROVIDER_SITE_OTHER): Payer: BC Managed Care – PPO | Admitting: Acute Care

## 2022-06-11 ENCOUNTER — Encounter: Payer: Self-pay | Admitting: Acute Care

## 2022-06-11 DIAGNOSIS — Z87891 Personal history of nicotine dependence: Secondary | ICD-10-CM | POA: Diagnosis not present

## 2022-06-11 NOTE — Patient Instructions (Signed)
Thank you for participating in the [facility]. It was our pleasure to meet you today. We will call you with the results of your scan within the next few days. Your scan will be assigned a Lung RADS category score by the physicians reading the scans.  This Lung RADS score determines follow up scanning.  See below for description of categories, and follow up screening recommendations. We will be in touch to schedule your follow up screening annually or based on recommendations of our providers. We will fax a copy of your scan results to your Primary Care Physician, or the physician who referred you to the program, to ensure they have the results. Please call the office if you have any questions or concerns regarding your scanning experience or results.  Our office number is [phone]. Please speak with Denise Phelps, RN. , or  Denise Buckner RN, They are  our Lung Cancer Screening RN.'s If They are unavailable when you call, Please leave a message on the voice mail. We will return your call at our earliest convenience.This voice mail is monitored several times a day.  Remember, if your scan is normal, we will scan you annually as long as you continue to meet the criteria for the program. (Age 62-77, Current smoker or smoker who has quit within the last 15 years). If you are a smoker, remember, quitting is the single most powerful action that you can take to decrease your risk of lung cancer and other pulmonary, breathing related problems. We know quitting is hard, and we are here to help.  Please let us know if there is anything we can do to help you meet your goal of quitting. If you are a former smoker, congratulations. We are proud of you! Remain smoke free! Remember you can refer friends or family members through the number above.  We will screen them to make sure they meet criteria for the program. Thank you for helping us take better care of you by  participating in Lung Screening.  You can receive free nicotine replacement therapy ( patches, gum or mints) by calling 1-800-QUIT NOW. Please call so we can get you on the path to becoming  a non-smoker. I know it is hard, but you can do this!  Lung RADS Categories:  Lung RADS 1: no nodules or definitely non-concerning nodules.  Recommendation is for a repeat annual scan in 12 months.  Lung RADS 2:  nodules that are non-concerning in appearance and behavior with a very low likelihood of becoming an active cancer. Recommendation is for a repeat annual scan in 12 months.  Lung RADS 3: nodules that are probably non-concerning , includes nodules with a low likelihood of becoming an active cancer.  Recommendation is for a 6-month repeat screening scan. Often noted after an upper respiratory illness. We will be in touch to make sure you have no questions, and to schedule your 6-month scan.  Lung RADS 4 A: nodules with concerning findings, recommendation is most often for a follow up scan in 3 months or additional testing based on our provider's assessment of the scan. We will be in touch to make sure you have no questions and to schedule the recommended 3 month follow up scan.  Lung RADS 4 B:  indicates findings that are concerning. We will be in touch with you to schedule additional diagnostic testing based on our provider's  assessment of the scan.  Other options for assistance in smoking cessation (   As covered by your insurance benefits)  Hypnosis for smoking cessation  [employer] [phone]  Acupuncture for smoking cessation  [facility] [phone]   

## 2022-06-11 NOTE — Progress Notes (Signed)
Virtual Visit via Telephone Note  I connected with Jodi Turner on 10/22/21 at  2:00 PM EST by telephone and verified that I am speaking with the correct person using two identifiers.  Location: Patient: Home Provider: Working from home    I discussed the limitations, risks, security and privacy concerns of performing an evaluation and management service by telephone and the availability of in person appointments. I also discussed with the patient that there may be a patient responsible charge related to this service. The patient expressed understanding and agreed to proceed.  Shared Decision Making Visit Lung Cancer Screening Program [phone])   Eligibility: Age 61 y.o. Pack Years Smoking History Calculation 33 (# packs/per year x # years smoked) Recent History of coughing up blood  no Unexplained weight loss? no ( >Than 15 pounds within the last 6 months ) Prior History Lung / other cancer no (Diagnosis within the last 5 years already requiring surveillance chest CT Scans). Smoking Status Former Smoker Former Smokers: Years since quit: 4 years  Quit Date: 06/2018  Visit Components: Discussion included one or more decision making aids. yes Discussion included risk/benefits of screening. yes Discussion included potential follow up diagnostic testing for abnormal scans. yes Discussion included meaning and risk of over diagnosis. yes Discussion included meaning and risk of False Positives. yes Discussion included meaning of total radiation exposure. yes  Counseling Included: Importance of adherence to annual lung cancer LDCT screening. yes Impact of comorbidities on ability to participate in the program. yes Ability and willingness to under diagnostic treatment. yes  Smoking Cessation Counseling: Current Smokers:  Discussed importance of smoking cessation. yes Information about tobacco cessation classes and interventions provided to patient. yes Patient provided with  "ticket" for LDCT Scan. yes Symptomatic Patient. no  Counseling NA Diagnosis Code: Tobacco Use Z72.0 Asymptomatic Patient yes  Counseling NA Former Smokers:  Discussed the importance of maintaining cigarette abstinence. yes Diagnosis Code: Personal History of Nicotine Dependence. 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 Yes Patient provided with "ticket" for LDCT Scan. yes Written Order for Lung Cancer Screening with LDCT placed in Epic. Yes (CT Chest Lung Cancer Screening Low Dose W/O CM) [id number] Z12.2-Screening of respiratory organs Z87.891-Personal history of nicotine dependence   I spent 25 minutes of face to face time with her discussing the risks and benefits of lung cancer screening. We viewed a power point together that explained in detail the above noted topics. We took the time to pause the power point at intervals to allow for questions to be asked and answered to ensure understanding. We discussed that she had taken the single most powerful action possible to decrease her risk of developing lung cancer when she quit smoking. I counseled her to remain smoke free, and to contact me if she ever had the desire to smoke again so that I can provide resources and tools to help support the effort to remain smoke free. We discussed the time and location of the scan, and that either  Doroteo Glassman RN or I will call with the results within  24-48 hours of receiving them. She has my card and contact information in the event she needs to speak with me, in addition to a copy of the power point we reviewed as a resource. She verbalized understanding of all of the above and had no further questions upon leaving the office.     I explained to the patient that there has been a high incidence  of coronary artery disease noted on these exams. I explained that this is a non-gated exam therefore degree or severity cannot be determined. This patient is not  on statin therapy. I have asked the patient to follow-up with their PCP regarding any incidental finding of coronary artery disease and management with diet or medication as they feel is clinically indicated. The patient verbalized understanding of the above and had no further questions.    Kushal Saunders D. Kenton Kingfisher, NP-C [facility] Personal contact information can be found on Amion  06/11/2022, 8:39 AM

## 2022-06-12 ENCOUNTER — Other Ambulatory Visit: Payer: Self-pay | Admitting: Acute Care

## 2022-06-12 ENCOUNTER — Ambulatory Visit (HOSPITAL_BASED_OUTPATIENT_CLINIC_OR_DEPARTMENT_OTHER)
Admission: RE | Admit: 2022-06-12 | Discharge: 2022-06-12 | Disposition: A | Discharge status: Home / Self Care | Payer: BC Managed Care – PPO | Source: Ambulatory Visit | Attending: Acute Care | Admitting: Acute Care

## 2022-06-12 DIAGNOSIS — J439 Emphysema, unspecified: Secondary | ICD-10-CM | POA: Diagnosis not present

## 2022-06-12 DIAGNOSIS — Z87891 Personal history of nicotine dependence: Secondary | ICD-10-CM | POA: Diagnosis present

## 2022-06-12 DIAGNOSIS — Z122 Encounter for screening for malignant neoplasm of respiratory organs: Secondary | ICD-10-CM | POA: Insufficient documentation

## 2022-06-12 DIAGNOSIS — I700 Atherosclerosis of aorta: Secondary | ICD-10-CM | POA: Insufficient documentation

## 2022-06-13 ENCOUNTER — Other Ambulatory Visit: Payer: BC Managed Care – PPO

## 2023-06-12 ENCOUNTER — Ambulatory Visit (HOSPITAL_BASED_OUTPATIENT_CLINIC_OR_DEPARTMENT_OTHER)
Admission: RE | Admit: 2023-06-12 | Discharge: 2023-06-12 | Disposition: A | Discharge status: Home / Self Care | Payer: BC Managed Care – PPO | Source: Ambulatory Visit | Attending: Acute Care | Admitting: Acute Care

## 2023-06-12 DIAGNOSIS — Z122 Encounter for screening for malignant neoplasm of respiratory organs: Secondary | ICD-10-CM | POA: Insufficient documentation

## 2023-06-12 DIAGNOSIS — Z87891 Personal history of nicotine dependence: Secondary | ICD-10-CM | POA: Diagnosis present

## 2023-06-16 ENCOUNTER — Other Ambulatory Visit: Payer: Self-pay | Admitting: Acute Care

## 2023-06-16 DIAGNOSIS — Z87891 Personal history of nicotine dependence: Secondary | ICD-10-CM

## 2023-06-16 DIAGNOSIS — Z122 Encounter for screening for malignant neoplasm of respiratory organs: Secondary | ICD-10-CM

## 2023-08-11 ENCOUNTER — Other Ambulatory Visit (HOSPITAL_COMMUNITY): Payer: Self-pay

## 2023-10-30 ENCOUNTER — Ambulatory Visit (INDEPENDENT_AMBULATORY_CARE_PROVIDER_SITE_OTHER): Payer: BC Managed Care – PPO | Admitting: Obstetrics and Gynecology

## 2023-10-30 ENCOUNTER — Encounter: Payer: Self-pay | Admitting: Obstetrics and Gynecology

## 2023-10-30 VITALS — BP 140/91 | HR 57 | Ht 65.87 in | Wt 159.0 lb

## 2023-10-30 DIAGNOSIS — N812 Incomplete uterovaginal prolapse: Secondary | ICD-10-CM | POA: Diagnosis not present

## 2023-10-30 DIAGNOSIS — R350 Frequency of micturition: Secondary | ICD-10-CM | POA: Diagnosis not present

## 2023-10-30 DIAGNOSIS — N393 Stress incontinence (female) (male): Secondary | ICD-10-CM

## 2023-10-30 DIAGNOSIS — R339 Retention of urine, unspecified: Secondary | ICD-10-CM | POA: Insufficient documentation

## 2023-10-30 LAB — POCT URINALYSIS DIPSTICK
Bilirubin, UA: NEGATIVE
Blood, UA: NEGATIVE
Glucose, UA: NEGATIVE
Ketones, UA: NEGATIVE
Leukocytes, UA: NEGATIVE
Nitrite, UA: NEGATIVE
Protein, UA: NEGATIVE
Spec Grav, UA: 1.02 (ref 1.010–1.025)
Urobilinogen, UA: 0.2 U/dL
pH, UA: 6.5 (ref 5.0–8.0)

## 2023-10-30 NOTE — Assessment & Plan Note (Signed)
-   Will have her undergo urodynamic testing to evaluate further

## 2023-10-30 NOTE — Assessment & Plan Note (Signed)
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  - Will evaluate with UDS. Discussed options of sling or urethral bulking

## 2023-10-30 NOTE — Assessment & Plan Note (Signed)
Stage II anterior, Stage I posterior, Stage I apical prolapse - For treatment of pelvic organ prolapse, we discussed options for management including expectant management, conservative management, and surgical management, such as Kegels, a pessary, pelvic floor physical therapy, and specific surgical procedures. - We discussed two options for prolapse repair:  1) vaginal repair without mesh - Pros - safer, no mesh complications - Cons - not as strong as mesh repair, higher risk of recurrence  2) laparoscopic repair with mesh - Pros - stronger, better long-term success - Cons - risks of mesh implant (erosion into vagina or bladder, adhering to the rectum, pain) - these risks are lower than with a vaginal mesh but still exist - Handouts provided on both options for her to consider
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
## 2023-10-30 NOTE — Patient Instructions (Signed)

## 2023-11-11 ENCOUNTER — Ambulatory Visit: Payer: BC Managed Care – PPO | Admitting: Obstetrics and Gynecology

## 2023-11-18 ENCOUNTER — Encounter: Payer: 59 | Admitting: Obstetrics and Gynecology

## 2023-11-27 ENCOUNTER — Encounter: Payer: Self-pay | Admitting: Obstetrics and Gynecology

## 2023-11-27 ENCOUNTER — Ambulatory Visit (INDEPENDENT_AMBULATORY_CARE_PROVIDER_SITE_OTHER): Payer: BC Managed Care – PPO | Admitting: Obstetrics and Gynecology

## 2023-11-27 VITALS — BP 136/71 | HR 92

## 2023-11-27 DIAGNOSIS — N812 Incomplete uterovaginal prolapse: Secondary | ICD-10-CM

## 2023-11-27 DIAGNOSIS — N393 Stress incontinence (female) (male): Secondary | ICD-10-CM

## 2023-11-27 NOTE — Progress Notes (Signed)
[facility] Return Visit  SUBJECTIVE  History of Present Illness: Jodi Turner is a 62 y.o. female seen in follow-up for prolapse and incontinence. Plan at last visit was to undergo urodynamic testing.  She canceled the urodynamic testing because she felt that she did not have enough leakage to warrant treatment. She would like to schedule surgery for her prolapse.   Past Medical History: Patient  has a past medical history of COPD (chronic obstructive pulmonary disease) (HCC), Frequency of urination, Pelvic pain, Sensation of pressure in bladder area, Urgency of urination, and Wears contact lenses.   Past Surgical History: She  has a past surgical history that includes Tubal ligation (1993); EXCISION BENIGN SKIN LESIONS; cysto with hydrodistension (N/A, 04/06/2014); Cystoscopy with urethral dilatation (N/A, 04/06/2014); and Cystoscopy w/ retrogrades (Right, 04/06/2014).   Medications: She has a current medication list which includes the following prescription(s): alprazolam and rosuvastatin.   Allergies: Patient has no known allergies.   Social History: Patient  reports that she quit smoking about 5 years ago. Her smoking use included cigarettes. She started smoking about 51 years ago. She has a 33.8 pack-year smoking history. She has never used smokeless tobacco. She reports current alcohol use. She reports that she does not use drugs.     OBJECTIVE     Physical Exam: Vitals:   11/27/23 0937  BP: 136/71  Pulse: 92   Gen: No apparent distress, A&O x 3.  Detailed Urogynecologic Evaluation:  Deferred. Prior exam showed:      No data to display             ASSESSMENT AND PLAN    Jodi Turner is a 62 y.o. with:  1. Uterovaginal prolapse, incomplete   2. SUI (stress urinary incontinence, female)     - We discussed that leakage may worsen after prolapse repair. We can defer urodynamics and not treat leakage initially, and then if she develops  more of an issue post operatively, then can address in the future. She has decided that she would prefer to treat everything at once if possible. Therefore will plan for urodynamic testing. We also discussed this test will ensure that she is emptying her bladder well.  - Will plan to review urodynamics results at her pre op visit.   Plan for surgery: Exam under anesthesia, robotic assisted total laparoscopic hysterectomy with bilateral salpingo-oophroectomy, sacrocolpopexy, cystoscopy, possible midurethral sling  - We reviewed the patient's specific anatomic and functional findings, with the assistance of diagrams, and together finalized the above procedure. The planned surgical procedures were discussed along with the surgical risks outlined below, which were also provided on a detailed handout. Additional treatment options including expectant management, conservative management, medical management were discussed where appropriate.  We reviewed the benefits and risks of each treatment option.   Prolapse (with or without mesh): Risk factors for surgical failure  include things that put pressure on your pelvis and the surgical repair, including obesity, chronic cough, and heavy lifting or straining (including lifting children or adults, straining on the toilet, or lifting heavy objects such as furniture or anything weighing >25 lbs. Risks of recurrence is 20-30% with vaginal native tissue repair and a less than 10% with sacrocolpopexy with mesh.    Sacrocolpopexy: Mesh implants may provide more prolapse support, but do have some unique risks to consider. It is important to understand that mesh is permanent and cannot be easily removed. Risks of abdominal sacrocolpopexy mesh include mesh exposure (~3-6%), painful intercourse (recent  studies show lower rates after surgery compared to before, with ~5-8% risk of new onset), and very rare risks of bowel or bladder injury or infection (<1%). The risk of mesh  exposure is more likely in a woman with risks for poor healing (prior radiation, poorly controlled diabetes, or immunocompromised). The risk of new or worsened chronic pain after mesh implant is more common in women with baseline chronic pain and/or poorly controlled anxiety or depression. There is an FDA safety notification on vaginal mesh procedures for prolapse but NOT abdominal mesh procedures and therefore does not apply to your surgery. We have extensive experience and training with mesh placement and we have close postoperative follow up to identify any potential complications from mesh.    - For preop Visit:  She is required to have a visit within 30 days of her surgery.    - Medical clearance: not required  - Anticoagulant use: No - Medicaid Hysterectomy form: No - Accepts blood transfusion: will confirm at pre op - Expected length of stay: outpatient  Request sent for surgery scheduling.   Marguerita Beards, MD

## 2023-12-16 ENCOUNTER — Encounter: Payer: Self-pay | Admitting: Obstetrics and Gynecology

## 2023-12-29 ENCOUNTER — Ambulatory Visit (INDEPENDENT_AMBULATORY_CARE_PROVIDER_SITE_OTHER): Payer: 59 | Admitting: Obstetrics and Gynecology

## 2023-12-29 ENCOUNTER — Encounter: Payer: Self-pay | Admitting: Obstetrics and Gynecology

## 2023-12-29 VITALS — BP 133/88 | HR 67

## 2023-12-29 DIAGNOSIS — N3281 Overactive bladder: Secondary | ICD-10-CM

## 2023-12-29 DIAGNOSIS — R339 Retention of urine, unspecified: Secondary | ICD-10-CM

## 2023-12-29 DIAGNOSIS — R350 Frequency of micturition: Secondary | ICD-10-CM

## 2023-12-29 LAB — POCT URINALYSIS DIPSTICK
Bilirubin, UA: NEGATIVE
Blood, UA: NEGATIVE
Glucose, UA: NEGATIVE
Ketones, UA: NEGATIVE
Leukocytes, UA: NEGATIVE
Nitrite, UA: NEGATIVE
Protein, UA: NEGATIVE
Spec Grav, UA: 1.02 (ref 1.010–1.025)
Urobilinogen, UA: 0.2 U/dL
pH, UA: 5.5 (ref 5.0–8.0)

## 2023-12-29 NOTE — Progress Notes (Signed)
[facility] Urodynamics Procedure  Referring Physician: Sigmund Hazel, MD Date of Procedure: 12/29/2023  Jodi Turner is a 63 y.o. female who presents for urodynamic evaluation. Indication(s) for study: SUI and incomplete bladder emptying.   Vital Signs: BP 133/88   Pulse 67   Laboratory Results: A catheterized urine specimen revealed:  Lab Results  Component Value Date   COLORU yellow 12/29/2023   CLARITYU clear 12/29/2023   GLUCOSEUR Negative 12/29/2023   BILIRUBINUR negative 12/29/2023   KETONESU negative 12/29/2023   SPECGRAV 1.020 12/29/2023   RBCUR negative 12/29/2023   PHUR 5.5 12/29/2023   PROTEINUR Negative 12/29/2023   UROBILINOGEN 0.2 12/29/2023   LEUKOCYTESUR Negative 12/29/2023      Voiding Diary: Deferred   Procedure Timeout:  The correct patient was verified and the correct procedure was verified. The patient was in the correct position and safety precautions were reviewed based on at the patient's history.  Urodynamic Procedure A 64F dual lumen urodynamics catheter was placed under sterile conditions into the patient's bladder. A 64F catheter was placed into the rectum in order to measure abdominal pressure. EMG patches were placed in the appropriate position.  All connections were confirmed and calibrations/adjusted made. Saline was instilled into the bladder through the dual lumen catheters.  Cough/valsalva pressures were measured periodically during filling.  Patient was allowed to void.  The bladder was then emptied of its residual.  UROFLOW: Revealed a Qmax of 8.4 mL/sec.  She voided 57 mL and had a residual of 25 mL.  It was a intermittent pattern and represented normal habits though interpretation limited due to low voided volume.  CMG: This was performed with sterile water in the sitting position at a fill rate of 30 mL/min.    First sensation of fullness was 68 mLs,  First urge was 157 mLs,  Strong urge was 185 mLs and   Capacity was 464 mLs  Stress incontinence was not demonstrated Highest negative Barrier CLPP was 103 cmH20 at 350 ml. Highest negative Barrier VLPP was 66 cmH20 at 350 ml.  Detrusor function was overactive, with phasic contractions seen.  The first occurred at 126 mL to 4 cm of water and was not associated with urge.  Compliance:  Normal. End fill detrusor pressure was 6.5cmH20.  Calculated compliance was 71.65mL/cmH20  UPP: MUCP with barrier reduction was 51 cm of water.    MICTURITION STUDY: Voiding was performed with reduction using scopettes in the sitting position.  Pdet at Qmax was 11.2 cm of water.  Qmax was 25.6 mL/sec.  It was a intermittent pattern on main void with another detrusor contraction and then void of approximately 20ml.  She voided 454 mL and had a residual of 10 mL.  It was a volitional void, sustained detrusor contraction was present and abdominal straining was not present  EMG: This was performed with patches.  She had voluntary contractions, recruitment with fill was present and urethral sphincter was not relaxed with void.  The details of the procedure with the study tracings have been scanned into EPIC.   Urodynamic Impression:  1. Sensation was increased; capacity was normal 2. Stress Incontinence was not demonstrated at normal pressures; 3. Detrusor Overactivity was demonstrated without leakage. 4. Emptying was dysfunctional with a normal PVR, a sustained detrusor contraction present,  abdominal straining not present, dyssynergic urethral sphincter activity on EMG.  Plan: - The patient will follow up  to discuss the findings and treatment options.

## 2024-01-06 ENCOUNTER — Ambulatory Visit: Payer: BC Managed Care – PPO | Admitting: Obstetrics and Gynecology

## 2024-01-29 NOTE — Telephone Encounter (Signed)
Surgery scheduled 3/19

## 2024-02-01 ENCOUNTER — Encounter: Payer: 59 | Admitting: Obstetrics and Gynecology

## 2024-02-03 ENCOUNTER — Ambulatory Visit (INDEPENDENT_AMBULATORY_CARE_PROVIDER_SITE_OTHER): Payer: 59 | Admitting: Obstetrics and Gynecology

## 2024-02-03 ENCOUNTER — Encounter: Payer: Self-pay | Admitting: Obstetrics and Gynecology

## 2024-02-03 VITALS — BP 144/76 | HR 76 | Ht 65.16 in | Wt 158.6 lb

## 2024-02-03 DIAGNOSIS — Z01818 Encounter for other preprocedural examination: Secondary | ICD-10-CM

## 2024-02-03 DIAGNOSIS — N812 Incomplete uterovaginal prolapse: Secondary | ICD-10-CM

## 2024-02-03 MED ORDER — CODEINE SULFATE 30 MG PO TABS: 30.0000 mg | ORAL_TABLET | Freq: Four times a day (QID) | ORAL | 0 refills | Status: DC | PRN

## 2024-02-03 MED ORDER — IBUPROFEN 600 MG PO TABS: 600.0000 mg | ORAL_TABLET | Freq: Four times a day (QID) | ORAL | 0 refills | Status: AC | PRN

## 2024-02-03 MED ORDER — POLYETHYLENE GLYCOL 3350 17 GM/SCOOP PO POWD: 17.0000 g | Freq: Every day | ORAL | 0 refills | Status: AC

## 2024-02-03 MED ORDER — ONDANSETRON HCL 4 MG PO TABS: 4.0000 mg | ORAL_TABLET | Freq: Three times a day (TID) | ORAL | 0 refills | Status: DC | PRN

## 2024-02-03 MED ORDER — ACETAMINOPHEN 500 MG PO TABS: 500.0000 mg | ORAL_TABLET | Freq: Four times a day (QID) | ORAL | 0 refills | Status: AC | PRN

## 2024-02-03 NOTE — H&P (Signed)
 [facility] Pre Op H&P  Subjective Chief Complaint: Philamena Kramar presents for a follow up after urodynamics  History of Present Illness: Izabela Ow is a 63 y.o. female with prolapse.  She is scheduled to undergo Exam under anesthesia, robotic assisted total laparoscopic hysterectomy with bilateral salpingo-oophroectomy, sacrocolpopexy, cystoscopy on 02/24/24.  Her symptoms include vaginal bulge, and she was was found to have Stage II anterior, Stage I posterior, Stage I apical prolapse.   Urodynamic Impression:  1. Sensation was increased; capacity was normal 2. Stress Incontinence was not demonstrated at normal pressures; 3. Detrusor Overactivity was demonstrated without leakage. 4. Emptying was dysfunctional with a normal PVR, a sustained detrusor contraction present,  abdominal straining not present, dyssynergic urethral sphincter activity on EMG  Past Medical History:  Diagnosis Date   COPD (chronic obstructive pulmonary disease) (HCC)    Frequency of urination    Pelvic pain    Sensation of pressure in bladder area    Urgency of urination    Wears contact lenses      Past Surgical History:  Procedure Laterality Date   CYSTO WITH HYDRODISTENSION N/A 04/06/2014   Procedure: CYSTOSCOPY/HYDRODISTENSION OF BLADDER;  Surgeon: Martina Sinner, MD;  Location: [facility];  Service: Urology;  Laterality: N/A;   CYSTOSCOPY W/ RETROGRADES Right 04/06/2014   Procedure: CYSTOSCOPY WITH RETROGRADE PYELOGRAM, INCISION OF RIGHT URETEROCELE, REMOVAL OF RIGHT DISTAL STONE;  Surgeon: Martina Sinner, MD;  Location: [facility] [place];  Service: Urology;  Laterality: Right;   CYSTOSCOPY WITH URETHRAL DILATATION N/A 04/06/2014   Procedure: CYSTOSCOPY WITH URETHRAL DILATATION;  Surgeon: Martina Sinner, MD;  Location: [facility] [place];  Service: Urology;  Laterality: N/A;   EXCISION BENIGN SKIN LESIONS     TUBAL LIGATION  1993     has no known allergies.   Family History  Problem Relation Age of Onset   Hypertension Father    Breast cancer Neg Hx     Social History   Tobacco Use   Smoking status: Former    Current packs/day: 0.00    Average packs/day: 0.8 packs/day for 45.0 years (33.8 ttl pk-yrs)    Types: Cigarettes    Start date: 38    Quit date: 2019    Years since quitting: 6.1   Smokeless tobacco: Never  Substance Use Topics   Alcohol use: Yes    Comment: occationally   Drug use: No     Review of Systems was negative for a full 10 system review except as noted in the History of Present Illness.  No current facility-administered medications for this encounter.  Current Outpatient Medications:    acetaminophen (TYLENOL) 500 MG tablet, Take 1 tablet (500 mg total) by mouth every 6 (six) hours as needed (pain)., Disp: 30 tablet, Rfl: 0   ALPRAZolam (XANAX) 0.25 MG tablet, Take 0.25 mg by mouth at bedtime as needed for anxiety., Disp: , Rfl:    codeine 30 MG tablet, Take 1 tablet (30 mg total) by mouth every 6 (six) hours as needed (pain)., Disp: 10 tablet, Rfl: 0   ibuprofen (ADVIL) 600 MG tablet, Take 1 tablet (600 mg total) by mouth every 6 (six) hours as needed., Disp: 30 tablet, Rfl: 0   ondansetron (ZOFRAN) 4 MG tablet, Take 1 tablet (4 mg total) by mouth every 8 (eight) hours as needed for nausea or vomiting., Disp: 10 tablet, Rfl: 0   polyethylene glycol powder (GLYCOLAX/MIRALAX) 17 GM/SCOOP powder, Take 17 g  by mouth daily. Drink 17g (1 scoop) dissolved in water per day., Disp: 255 g, Rfl: 0   rosuvastatin (CRESTOR) 10 MG tablet, Take 10 mg by mouth daily., Disp: , Rfl:    Objective There were no vitals filed for this visit.   Gen: NAD CV: S1 S2 RRR Lungs: Clear to auscultation bilaterally Abd: soft, nontender   Previous Pelvic Exam showed: POP-Q   1                                            Aa   1                                           Ba   -5                                               C    3                                            Gh   3.5                                            Pb   9                                            tvl    -2                                            Ap   -2                                            Bp   -8                                              D           Assessment/ Plan  Assessment: The patient is a 63 y.o. year old scheduled to undergo Exam under anesthesia, robotic assisted total laparoscopic hysterectomy with bilateral salpingo-oophroectomy, sacrocolpopexy, cystoscopy.      Marguerita Beards, MD

## 2024-02-03 NOTE — Progress Notes (Signed)
 [facility] Return visit  Subjective Chief Complaint: Jodi Turner presents for a follow up after urodynamics  History of Present Illness: Jodi Turner is a 63 y.o. female with prolapse.  She is scheduled to undergo Exam under anesthesia, robotic assisted total laparoscopic hysterectomy with bilateral salpingo-oophroectomy, sacrocolpopexy, cystoscopy on 02/24/24.  Her symptoms include vaginal bulge, and she was was found to have Stage II anterior, Stage I posterior, Stage I apical prolapse.   Urodynamic Impression:  1. Sensation was increased; capacity was normal 2. Stress Incontinence was not demonstrated at normal pressures; 3. Detrusor Overactivity was demonstrated without leakage. 4. Emptying was dysfunctional with a normal PVR, a sustained detrusor contraction present,  abdominal straining not present, dyssynergic urethral sphincter activity on EMG  Past Medical History:  Diagnosis Date   COPD (chronic obstructive pulmonary disease) (HCC)    Frequency of urination    Pelvic pain    Sensation of pressure in bladder area    Urgency of urination    Wears contact lenses      Past Surgical History:  Procedure Laterality Date   CYSTO WITH HYDRODISTENSION N/A 04/06/2014   Procedure: CYSTOSCOPY/HYDRODISTENSION OF BLADDER;  Surgeon: Martina Sinner, MD;  Location: [facility] [place];  Service: Urology;  Laterality: N/A;   CYSTOSCOPY W/ RETROGRADES Right 04/06/2014   Procedure: CYSTOSCOPY WITH RETROGRADE PYELOGRAM, INCISION OF RIGHT URETEROCELE, REMOVAL OF RIGHT DISTAL STONE;  Surgeon: Martina Sinner, MD;  Location: [facility];  Service: Urology;  Laterality: Right;   CYSTOSCOPY WITH URETHRAL DILATATION N/A 04/06/2014   Procedure: CYSTOSCOPY WITH URETHRAL DILATATION;  Surgeon: Martina Sinner, MD;  Location: [facility] ;  Service: Urology;  Laterality: N/A;   EXCISION BENIGN SKIN LESIONS     TUBAL LIGATION   1993    has no known allergies.   Family History  Problem Relation Age of Onset   Hypertension Father    Breast cancer Neg Hx     Social History   Tobacco Use   Smoking status: Former    Current packs/day: 0.00    Average packs/day: 0.8 packs/day for 45.0 years (33.8 ttl pk-yrs)    Types: Cigarettes    Start date: 40    Quit date: 2019    Years since quitting: 6.1   Smokeless tobacco: Never  Substance Use Topics   Alcohol use: Yes    Comment: occationally   Drug use: No     Review of Systems was negative for a full 10 system review except as noted in the History of Present Illness.   Current Outpatient Medications:    acetaminophen (TYLENOL) 500 MG tablet, Take 1 tablet (500 mg total) by mouth every 6 (six) hours as needed (pain)., Disp: 30 tablet, Rfl: 0   ALPRAZolam (XANAX) 0.25 MG tablet, Take 0.25 mg by mouth at bedtime as needed for anxiety., Disp: , Rfl:    codeine 30 MG tablet, Take 1 tablet (30 mg total) by mouth every 6 (six) hours as needed (pain)., Disp: 10 tablet, Rfl: 0   ibuprofen (ADVIL) 600 MG tablet, Take 1 tablet (600 mg total) by mouth every 6 (six) hours as needed., Disp: 30 tablet, Rfl: 0   ondansetron (ZOFRAN) 4 MG tablet, Take 1 tablet (4 mg total) by mouth every 8 (eight) hours as needed for nausea or vomiting., Disp: 10 tablet, Rfl: 0   polyethylene glycol powder (GLYCOLAX/MIRALAX) 17 GM/SCOOP powder, Take 17 g by mouth daily. Drink 17g (1 scoop) dissolved  in water per day., Disp: 255 g, Rfl: 0   rosuvastatin (CRESTOR) 10 MG tablet, Take 10 mg by mouth daily., Disp: , Rfl:    Objective Vitals:   02/03/24 0908  BP: (!) 144/76  Pulse: 76    Gen: NAD CV: S1 S2 RRR Lungs: Clear to auscultation bilaterally Abd: soft, nontender   Previous Pelvic Exam showed: POP-Q   1                                            Aa   1                                           Ba   -5                                              C    3                                             Gh   3.5                                            Pb   9                                            tvl    -2                                            Ap   -2                                            Bp   -8                                              D           Assessment/ Plan  Assessment: The patient is a 63 y.o. year old scheduled to undergo Exam under anesthesia, robotic assisted total laparoscopic hysterectomy with bilateral salpingo-oophroectomy, sacrocolpopexy, cystoscopy.  No need for concurrent incontinence procedure based on urodynamics.   Plan: General Surgical Consent: The patient has previously been counseled on alternative treatments, and the decision by the patient and provider was to proceed with the procedure listed above.  For all procedures, there are risks of bleeding, infection, damage to surrounding organs including but not limited to bowel, bladder, blood vessels, ureters and nerves, and  need for further surgery if an injury were to occur. These risks are all low with minimally invasive surgery.   There are risks of numbness and weakness at any body site or buttock/rectal pain.  It is possible that baseline pain can be worsened by surgery, either with or without mesh. If surgery is vaginal, there is also a low risk of possible conversion to laparoscopy or open abdominal incision where indicated. Very rare risks include blood transfusion, blood clot, heart attack, pneumonia, or death.   There is also a risk of short-term postoperative urinary retention with need to use a catheter. About half of patients need to go home from surgery with a catheter, which is then later removed in the office. The risk of long-term need for a catheter is very low. There is also a risk of worsening of overactive bladder.    Prolapse (with or without mesh): Risk factors for surgical failure  include things that put pressure on your pelvis and  the surgical repair, including obesity, chronic cough, and heavy lifting or straining (including lifting children or adults, straining on the toilet, or lifting heavy objects such as furniture or anything weighing >25 lbs. Risks of recurrence is 20-30% with vaginal native tissue repair and a less than 10% with sacrocolpopexy with mesh.    Sacrocolpopexy: Mesh implants may provide more prolapse support, but do have some unique risks to consider. It is important to understand that mesh is permanent and cannot be easily removed. Risks of abdominal sacrocolpopexy mesh include mesh exposure (~3-6%), painful intercourse (recent studies show lower rates after surgery compared to before, with ~5-8% risk of new onset), and very rare risks of bowel or bladder injury or infection (<1%). The risk of mesh exposure is more likely in a woman with risks for poor healing (prior radiation, poorly controlled diabetes, or immunocompromised). The risk of new or worsened chronic pain after mesh implant is more common in women with baseline chronic pain and/or poorly controlled anxiety or depression. There is an FDA safety notification on vaginal mesh procedures for prolapse but NOT abdominal mesh procedures and therefore does not apply to your surgery. We have extensive experience and training with mesh placement and we have close postoperative follow up to identify any potential complications from mesh.    We discussed consent for blood products. Risks for blood transfusion include allergic reactions, other reactions that can affect different body organs and managed accordingly, transmission of infectious diseases such as HIV or Hepatitis. However, the blood is screened. Patient consents for blood products.  Pre-operative instructions:  She was instructed to not take Aspirin/NSAIDs x 7days prior to surgery. She may continue her 81mg  ASA. Antibiotic prophylaxis was ordered as indicated.  Catheter use: Patient will go home with  foley if needed after post-operative voiding trial.  Post-operative instructions:  She was provided with specific post-operative instructions, including precautions and signs/symptoms for which we would recommend contacting us, in addition to daytime and after-hours contact phone numbers. This was provided on a handout.   Post-operative medications: Prescriptions for motrin, tylenol, miralax, zofran and codeine were sent to her pharmacy. Discussed using ibuprofen and tylenol on a schedule to limit use of narcotics.   Laboratory testing:  We will check labs: CBC and type and screen  Preoperative clearance:  She does not require surgical clearance.    Post-operative follow-up:  A post-operative appointment will be made for 6 weeks from the date of surgery. If she needs a post-operative nurse visit for  a voiding trial, that will be set up after she leaves the hospital.    Patient will call the clinic or use MyChart should anything change or any new issues arise.   Marguerita Beards, MD

## 2024-02-09 ENCOUNTER — Telehealth: Payer: Self-pay

## 2024-02-09 NOTE — Telephone Encounter (Signed)
 Patient called the office requesting information regarding her insurance coverage for her surgery on 02/24/2024. Also, how much will she have to pay out of pocket, if any? Please advise. Thanks.

## 2024-02-15 ENCOUNTER — Encounter (HOSPITAL_COMMUNITY): Payer: Self-pay | Admitting: Obstetrics and Gynecology

## 2024-02-16 ENCOUNTER — Encounter (HOSPITAL_COMMUNITY): Payer: Self-pay | Admitting: Obstetrics and Gynecology

## 2024-02-16 NOTE — Pre-Procedure Instructions (Signed)
 Surgical Instructions   Your procedure is scheduled on :  Wednesday,  02-24-2024. Report to [facility] Main Entrance "A" at 6:30 A.M., then check in with the Admitting office. Any questions or running late day of surgery: call [phone]  Questions prior to your surgery date: call [phone] or [phone] , Monday-Friday, 8am-4pm. If you experience any cold or flu symptoms such as cough, fever, chills, shortness of breath, etc. between now and your scheduled surgery, please notify your surgeon office.    Remember:  Do not eat aany food after midnight the night before your surgery.  You may have clear liquids from midnight night before surgery until 5:30 AM.   You may drink clear liquids until 5:30 AM the morning of your surgery.   Clear liquids allowed are:  Water Carbonated Beverages Clear Tea (may sweeten, no milk, honey, etc.) Black Coffee Only (may sweeten, NO MILK, CREAM OR POWDERED CREAMER of any kind) Gatorade. NO clear liquids after 5:30 AM day of surgery.  This includes no water,  candy,  gum,  and  mints.    Take these medicines the morning of surgery with A SIP OF WATER :  none   May take these medicines IF NEEDED:  with sips of water Alprazolam (xanax)    One week prior to surgery, STOP taking any Aspirin (unless otherwise instructed by your surgeon) Aleve, Naproxen, Ibuprofen, Motrin, Advil, Goody's, BC's, all herbal medications, fish oil, and non-prescription vitamins.                     Do NOT Smoke (Tobacco/Vaping) for 24 hours prior to your procedure.  If you use a CPAP at night, you may bring your mask/headgear for your overnight stay.   You will be asked to remove any contacts, glasses, piercing's, hearing aid's, dentures/partials prior to surgery. Please bring cases for these items if needed.    Patients discharged the day of surgery will not be allowed to drive home, and someone needs to stay with them for 24 hours.  SURGICAL WAITING ROOM  VISITATION Patients may have no more than 2 support people in the waiting area - these visitors may rotate.   Pre-op nurse will coordinate an appropriate time for 1 ADULT support person, who may not rotate, to accompany patient in pre-op.  Children under the age of 25 must have an adult with them who is not the patient and must remain in the main waiting area with an adult.  If the patient needs to stay at the hospital during part of their recovery, the visitor guidelines for inpatient rooms apply.  Please refer to the [facility] website for the visitor guidelines for any additional information.   If you received a COVID test during your pre-op visit  it is requested that you wear a mask when out in public, stay away from anyone that may not be feeling well and notify your surgeon if you develop symptoms. If you have been in contact with anyone that has tested positive in the last 10 days please notify you surgeon.      Pre-operative CHG Bathing Instructions   You can play a key role in reducing the risk of infection after surgery. Your skin needs to be as free of germs as possible. You can reduce the number of germs on your skin by washing with CHG (chlorhexidine gluconate) soap before surgery. CHG is an antiseptic soap that kills germs and continues to kill germs even after washing.  DO NOT use if you have an allergy to chlorhexidine/CHG or antibacterial soaps. If your skin becomes reddened or irritated, stop using the CHG and notify Pre-Op nurse day of surgery.  If you have any skin irritation or problems with the surgical soap (CHG), do not use.  Please get a bar of gold dial soap or any antibacterial soap and shower following the instructions below.             TAKE A SHOWER THE NIGHT BEFORE SURGERY AND THE DAY OF SURGERY    Please keep in mind the following:  DO NOT shave, including legs and underarms, 48 hours prior to surgery.   You may shave your face before/day of surgery.   Place clean sheets on your bed the night before surgery Use a clean washcloth (not used since being washed) for each shower. DO NOT sleep with pet's night before surgery.  CHG Shower Instructions:  Wash your face and private area with normal soap. If you choose to wash your hair, wash first with your normal shampoo.  After you use shampoo/soap, rinse your hair and body thoroughly to remove shampoo/soap residue.  Turn the water OFF and apply half the bottle of CHG soap to a CLEAN washcloth.  Apply CHG soap ONLY FROM YOUR NECK DOWN TO YOUR TOES (washing for 3-5 minutes)  DO NOT use CHG soap on face, private areas, open wounds, or sores.  Pay special attention to the area where your surgery is being performed.  If you are having back surgery, having someone wash your back for you may be helpful. Wait 2 minutes after CHG soap is applied, then you may rinse off the CHG soap.  Pat dry with a clean towel  Put on clean pajamas    Additional instructions for the day of surgery: DO NOT APPLY any lotions, oils, deodorants (may use underarm deodorant), cologne/ perfumes or makeup Do not wear jewelry /  piercing's/  metal/  permanent jewelry must be removed prior to arrival Do not wear nail polish, gel polish, artificial nails, or any other type of covering on natural nails (fingers and toes) Do not bring valuables to the hospital. [facility] is not responsible for valuables/personal belongings. Put on clean/comfortable clothes.  Please brush your teeth.  Ask your nurse before applying any prescription medications to the skin.

## 2024-02-16 NOTE — Progress Notes (Signed)
 Spoke w/ via phone for pre-op interview--- pt Lab needs dos---- no        Lab results------ lab appt 02-22-2024 @ 1300 getting CBC/ T&S COVID test -----patient states asymptomatic no test needed Arrive at ------- 0630 on 02-24-2024 NPO after MN NO Solid Food.  Clear liquids from MN until--- 0530 Pre-Surgery Ensure or G2: n/a ERAS protocol  Med rec completed Medications to take morning of surgery -----  if needed may take xanax Diabetic medication ----- n/a  GLP1 agonist last dose: n/a GLP1 instructions:  Patient instructed no nail polish to be worn day of surgery Patient instructed to bring photo id and insurance card day of surgery Patient aware to have Driver (ride ) / caregiver    for 24 hours after surgery - husband, robert Patient Special Instructions ----- will pick up bag w/ CHG and written instructions at lab appt Pre-Op special Instructions ----- n/a  Patient verbalized understanding of instructions that were given at this phone interview. Patient denies chest pain, sob, fever, cough at the interview.

## 2024-02-22 ENCOUNTER — Encounter (HOSPITAL_COMMUNITY)
Admission: RE | Admit: 2024-02-22 | Discharge: 2024-02-22 | Disposition: A | Discharge status: Home / Self Care | Source: Ambulatory Visit | Attending: Obstetrics and Gynecology | Admitting: Obstetrics and Gynecology

## 2024-02-22 DIAGNOSIS — Z01818 Encounter for other preprocedural examination: Secondary | ICD-10-CM | POA: Diagnosis present

## 2024-02-22 DIAGNOSIS — Z01812 Encounter for preprocedural laboratory examination: Secondary | ICD-10-CM | POA: Diagnosis not present

## 2024-02-22 DIAGNOSIS — N812 Incomplete uterovaginal prolapse: Secondary | ICD-10-CM | POA: Insufficient documentation

## 2024-02-22 LAB — CBC
HCT: 42.1 % (ref 36.0–46.0)
Hemoglobin: 13.8 g/dL (ref 12.0–15.0)
MCH: 31.4 pg (ref 26.0–34.0)
MCHC: 32.8 g/dL (ref 30.0–36.0)
MCV: 95.9 fL (ref 80.0–100.0)
Platelets: 280 10*3/uL (ref 150–400)
RBC: 4.39 MIL/uL (ref 3.87–5.11)
RDW: 11.9 % (ref 11.5–15.5)
WBC: 6 10*3/uL (ref 4.0–10.5)
nRBC: 0 % (ref 0.0–0.2)

## 2024-02-22 LAB — TYPE AND SCREEN
ABO/RH(D): O POS
Antibody Screen: NEGATIVE

## 2024-02-22 IMAGING — CT CT CARDIAC CORONARY ARTERY CALCIUM SCORE
3 series · 14 of 20 positions shown, 16 images · non-contrast
Comparison: CT chest 05/10/2018

CLINICAL DATA: 61-year-old white female with pure
hypercholesterolemia. Family history of coronary disease.

EXAM:
CT CARDIAC CORONARY ARTERY CALCIUM SCORE
TECHNIQUE: Non-contrast imaging through the heart was performed using
prospective ECG gating. Image post processing was performed on an
independent workstation, allowing for quantitative analysis of the
heart and coronary arteries. Note that this exam targets the heart
and the chest was not imaged in its entirety.

[Series 2: calcium scoring 2.00 qr36 bestdiast 69% hrt calciu · axial · 0.38mm/px · z∈[+1548,+1634]mm · 4 of 73 slices shown]
[im 15/73  vessel]
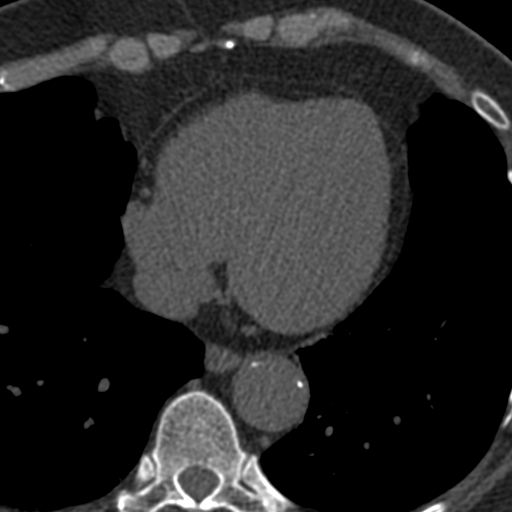
[im 29/73  vessel]
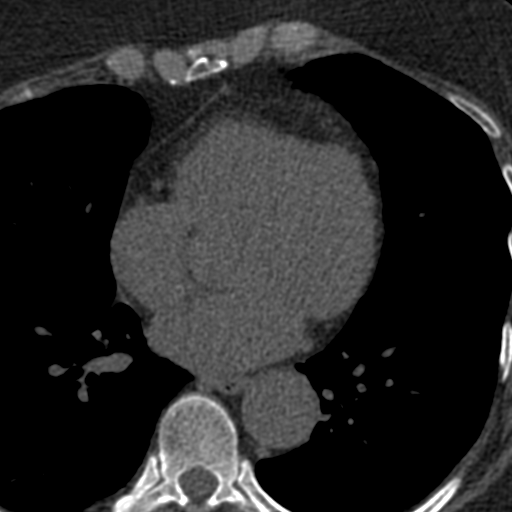
[im 44/73  vessel]
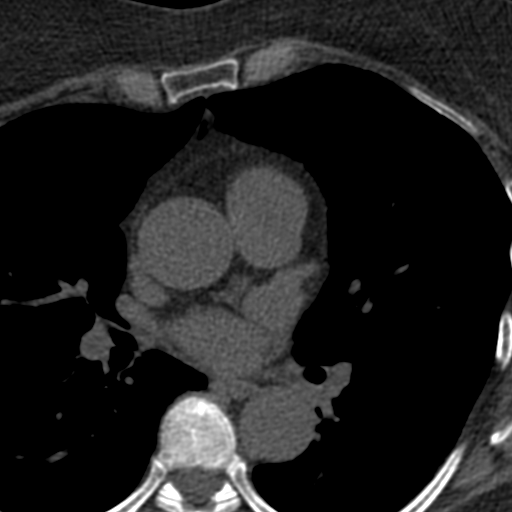
[im 58/73  vessel]
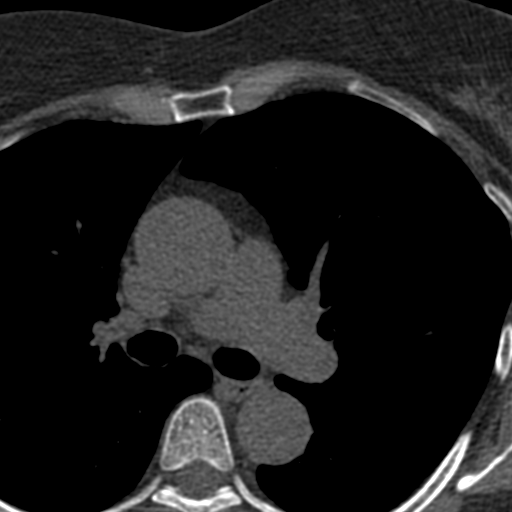

[Series 3: calcium scoring 2.00 br40 bestdiast 69% axial · axial · 0.57mm/px · z∈[+1536,+1638]mm · 5 of 77 slices shown, 7 images]
[im 13/77  vessel]
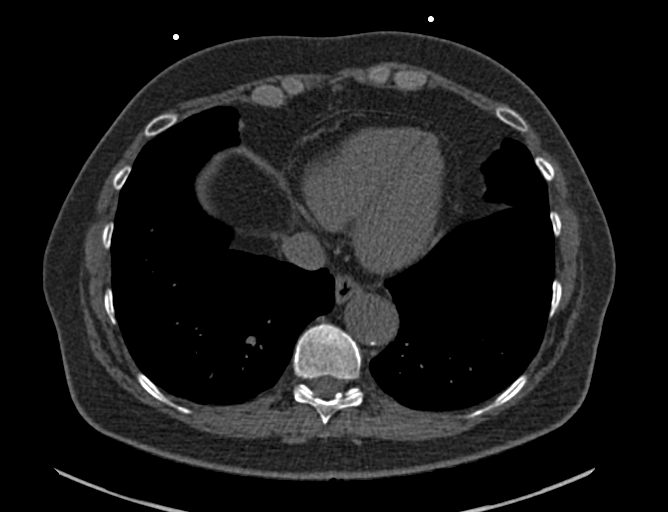
[im 13/77  lung]
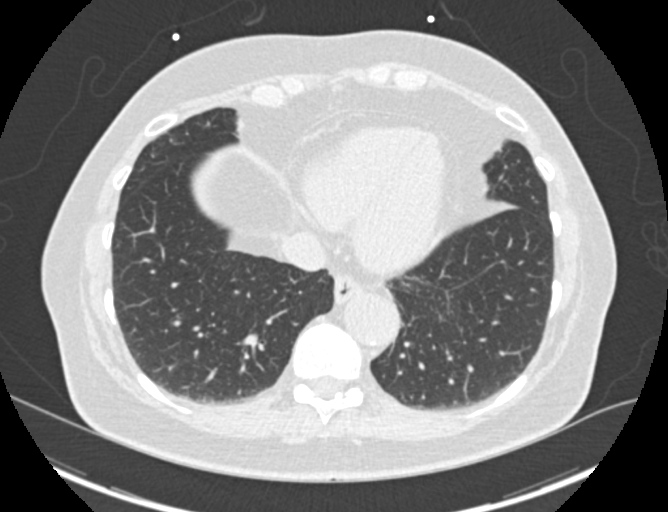
[im 26/77  vessel]
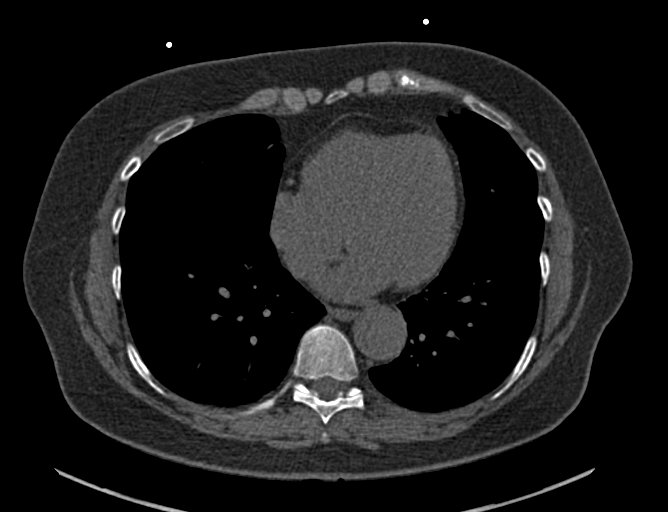
[im 39/77  vessel]
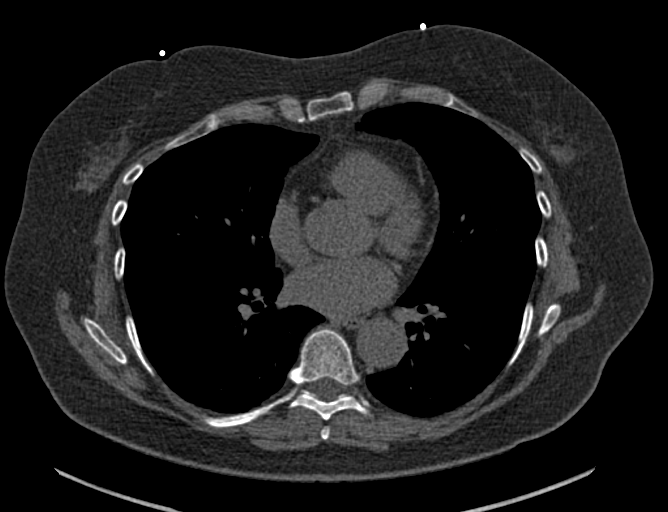
[im 51/77  vessel]
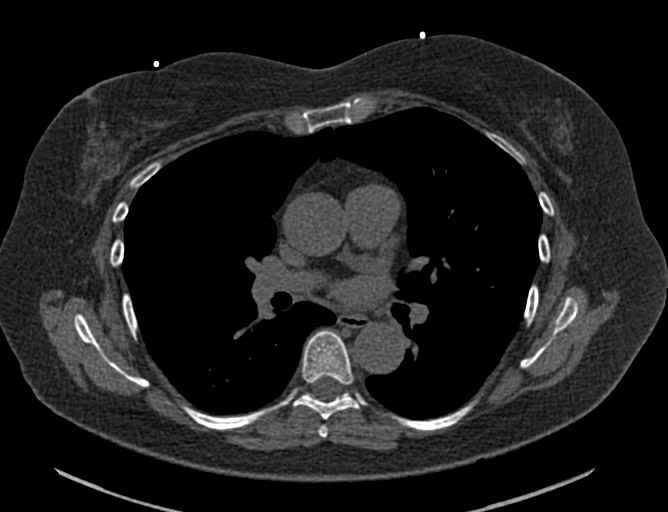
[im 64/77  vessel]
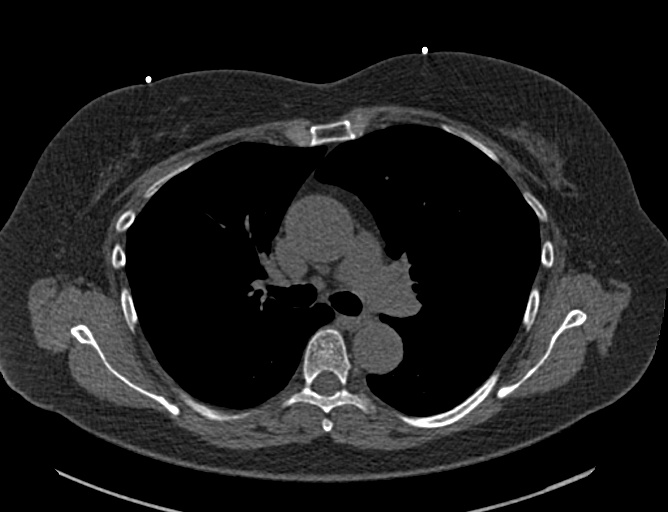
[im 64/77  lung]
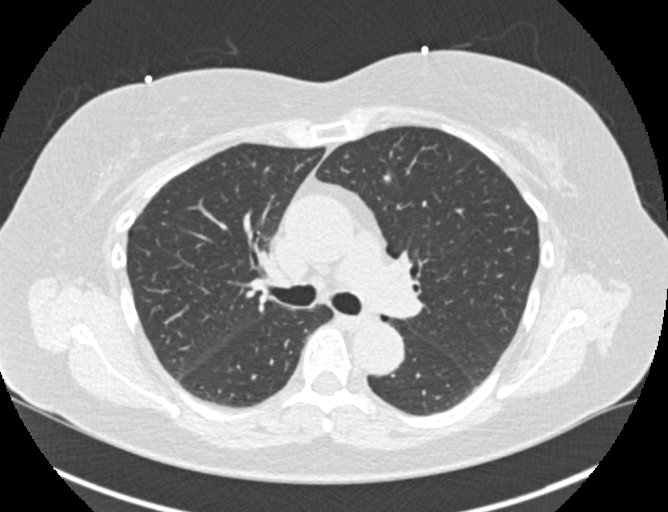

[Series 9: calcium scoring 2.00 br60 bestdiast 69% lungs · axial · 0.57mm/px · z∈[+1536,+1638]mm · 5 of 77 slices shown]
[im 13/77  vessel]
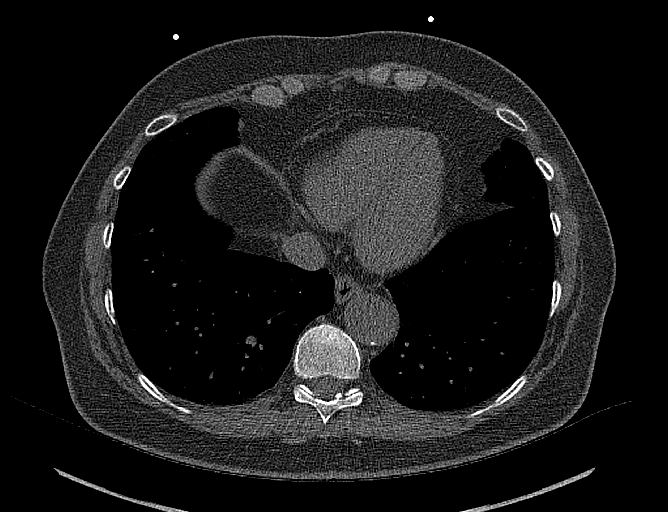
[im 26/77  vessel]
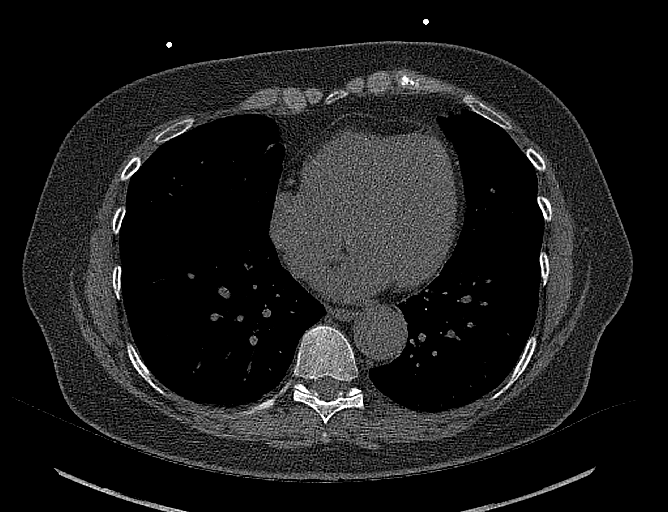
[im 39/77  vessel]
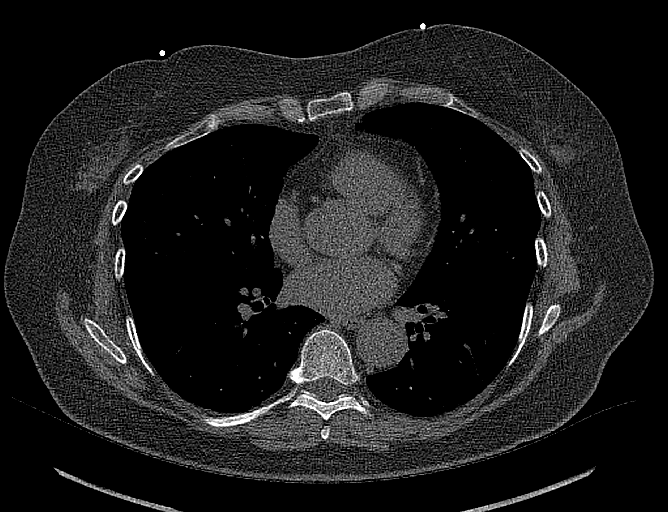
[im 51/77  vessel]
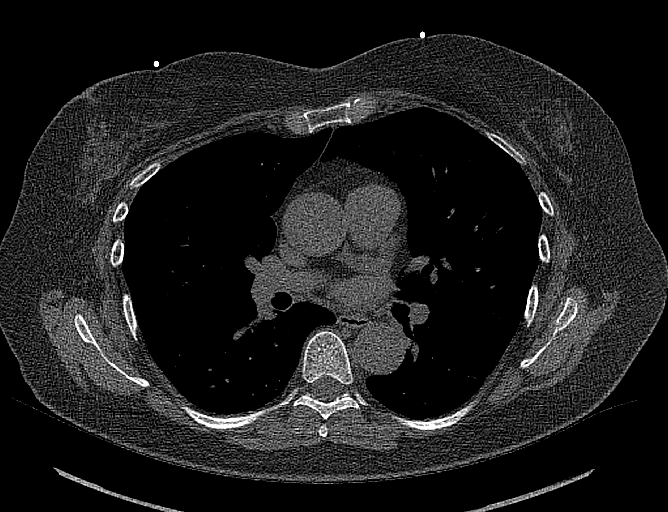
[im 64/77  vessel]
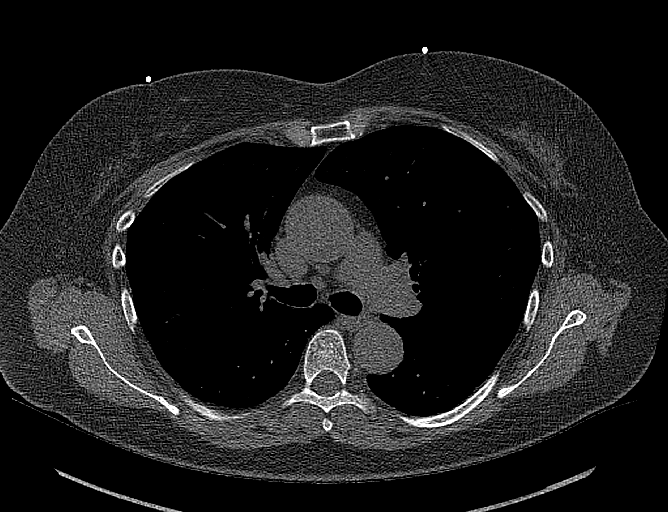

[14 of 20 positions shown; findings below may reference images not displayed]

FINDINGS: CORONARY CALCIUM SCORES:

Left Main: 0

LAD: 0

LCx: 0

RCA: 0

Total Agatston Score: 0

[HOSPITAL] percentile: 0

AORTA MEASUREMENTS:

Ascending Aorta: 35 mm

Descending Aorta: 28 mm

OTHER FINDINGS:

Small amount of calcium involving the descending thoracic aorta.
Heart size is normal. Visualized mediastinal structures are normal.
Images of the upper abdomen are unremarkable. Stable 3 mm nodule in
the posterior right upper lobe on sequence 9 image 16. Small
pleural-based densities along the anterior right lung on sequence 9
image 24 most likely represent scarring or atelectasis. No pleural
effusions. No acute bone abnormality.
IMPRESSION: Coronary calcium score is 0.

## 2024-02-24 ENCOUNTER — Encounter (HOSPITAL_COMMUNITY): Payer: Self-pay | Admitting: Obstetrics and Gynecology

## 2024-02-24 ENCOUNTER — Encounter (HOSPITAL_COMMUNITY)
Admission: RE | Disposition: A | Discharge status: Home / Self Care | Payer: Self-pay | Source: Home / Self Care | Attending: Obstetrics and Gynecology

## 2024-02-24 ENCOUNTER — Ambulatory Visit (HOSPITAL_BASED_OUTPATIENT_CLINIC_OR_DEPARTMENT_OTHER)

## 2024-02-24 ENCOUNTER — Other Ambulatory Visit: Payer: Self-pay

## 2024-02-24 ENCOUNTER — Ambulatory Visit (HOSPITAL_COMMUNITY)

## 2024-02-24 ENCOUNTER — Ambulatory Visit (HOSPITAL_COMMUNITY)
Admission: RE | Admit: 2024-02-24 | Discharge: 2024-02-24 | Disposition: A | Discharge status: Home / Self Care | Payer: 59 | Attending: Obstetrics and Gynecology | Admitting: Obstetrics and Gynecology

## 2024-02-24 DIAGNOSIS — J449 Chronic obstructive pulmonary disease, unspecified: Secondary | ICD-10-CM | POA: Insufficient documentation

## 2024-02-24 DIAGNOSIS — Z87891 Personal history of nicotine dependence: Secondary | ICD-10-CM | POA: Diagnosis not present

## 2024-02-24 DIAGNOSIS — N393 Stress incontinence (female) (male): Secondary | ICD-10-CM

## 2024-02-24 DIAGNOSIS — N812 Incomplete uterovaginal prolapse: Secondary | ICD-10-CM

## 2024-02-24 DIAGNOSIS — Z01818 Encounter for other preprocedural examination: Secondary | ICD-10-CM

## 2024-02-24 HISTORY — DX: Diaphragmatic hernia without obstruction or gangrene: K44.9

## 2024-02-24 HISTORY — DX: Migraine, unspecified, not intractable, without status migrainosus: G43.909

## 2024-02-24 HISTORY — PX: CYSTOSCOPY: SHX5120

## 2024-02-24 HISTORY — DX: Emphysema, unspecified: J43.9

## 2024-02-24 HISTORY — DX: Stress incontinence (female) (male): N39.3

## 2024-02-24 HISTORY — DX: Gastro-esophageal reflux disease without esophagitis: K21.9

## 2024-02-24 HISTORY — DX: Incomplete uterovaginal prolapse: N81.2

## 2024-02-24 HISTORY — PX: XI ROBOTIC ASSISTED TOTAL HYSTERECTOMY WITH SACROCOLPOPEXY: SHX6825

## 2024-02-24 HISTORY — DX: Personal history of urinary calculi: Z87.442

## 2024-02-24 LAB — ABO/RH: ABO/RH(D): O POS

## 2024-02-24 SURGERY — HYSTERECTOMY, TOTAL, ROBOT-ASSISTED, WITH SACROCOLPOPEXY
Anesthesia: General

## 2024-02-24 MED ORDER — KETOROLAC TROMETHAMINE 30 MG/ML IJ SOLN
INTRAMUSCULAR | Status: DC | PRN
Start: 2024-02-24 — End: 2024-02-24
  Administered 2024-02-24: 30 mg via INTRAVENOUS

## 2024-02-24 MED ORDER — PHENYLEPHRINE 80 MCG/ML (10ML) SYRINGE FOR IV PUSH (FOR BLOOD PRESSURE SUPPORT)
PREFILLED_SYRINGE | INTRAVENOUS | Status: AC
Start: 2024-02-24 — End: ?
  Filled 2024-02-24: qty 10

## 2024-02-24 MED ORDER — DEXMEDETOMIDINE HCL IN NACL 80 MCG/20ML IV SOLN
INTRAVENOUS | Status: DC | PRN
  Administered 2024-02-24 (×2): 4 ug via INTRAVENOUS

## 2024-02-24 MED ORDER — DEXMEDETOMIDINE HCL IN NACL 80 MCG/20ML IV SOLN
INTRAVENOUS | Status: AC
  Filled 2024-02-24: qty 20

## 2024-02-24 MED ORDER — ENOXAPARIN SODIUM 40 MG/0.4ML IJ SOSY
40.0000 mg | PREFILLED_SYRINGE | INTRAMUSCULAR | Status: AC
  Administered 2024-02-24: 40 mg via SUBCUTANEOUS

## 2024-02-24 MED ORDER — EPHEDRINE SULFATE-NACL 50-0.9 MG/10ML-% IV SOSY
PREFILLED_SYRINGE | INTRAVENOUS | Status: DC | PRN
  Administered 2024-02-24: 5 mg via INTRAVENOUS

## 2024-02-24 MED ORDER — BUPIVACAINE HCL (PF) 0.25 % IJ SOLN
INTRAMUSCULAR | Status: AC
  Filled 2024-02-24: qty 30

## 2024-02-24 MED ORDER — PHENAZOPYRIDINE HCL 100 MG PO TABS
200.0000 mg | ORAL_TABLET | ORAL | Status: AC
  Administered 2024-02-24: 200 mg via ORAL

## 2024-02-24 MED ORDER — ROCURONIUM BROMIDE 10 MG/ML (PF) SYRINGE
PREFILLED_SYRINGE | INTRAVENOUS | Status: DC | PRN
  Administered 2024-02-24: 10 mg via INTRAVENOUS
  Administered 2024-02-24: 70 mg via INTRAVENOUS

## 2024-02-24 MED ORDER — LIDOCAINE 2% (20 MG/ML) 5 ML SYRINGE
INTRAMUSCULAR | Status: DC | PRN
  Administered 2024-02-24: 80 mg via INTRAVENOUS

## 2024-02-24 MED ORDER — CEFAZOLIN SODIUM-DEXTROSE 2-4 GM/100ML-% IV SOLN
INTRAVENOUS | Status: AC
  Filled 2024-02-24: qty 100

## 2024-02-24 MED ORDER — GLYCOPYRROLATE 0.2 MG/ML IJ SOLN
INTRAMUSCULAR | Status: DC | PRN
Start: 2024-02-24 — End: 2024-02-24
  Administered 2024-02-24: .2 mg via INTRAVENOUS

## 2024-02-24 MED ORDER — ACETAMINOPHEN 500 MG PO TABS
1000.0000 mg | ORAL_TABLET | ORAL | Status: AC
  Administered 2024-02-24: 1000 mg via ORAL

## 2024-02-24 MED ORDER — ONDANSETRON HCL 4 MG/2ML IJ SOLN
INTRAMUSCULAR | Status: AC
  Filled 2024-02-24: qty 2

## 2024-02-24 MED ORDER — LIDOCAINE-EPINEPHRINE 1 %-1:100000 IJ SOLN
INTRAMUSCULAR | Status: AC
  Filled 2024-02-24: qty 1

## 2024-02-24 MED ORDER — MEPERIDINE HCL 25 MG/ML IJ SOLN: 6.2500 mg | INTRAMUSCULAR | Status: DC | PRN

## 2024-02-24 MED ORDER — PHENYLEPHRINE 80 MCG/ML (10ML) SYRINGE FOR IV PUSH (FOR BLOOD PRESSURE SUPPORT)
PREFILLED_SYRINGE | INTRAVENOUS | Status: DC | PRN
  Administered 2024-02-24: 80 ug via INTRAVENOUS

## 2024-02-24 MED ORDER — POVIDONE-IODINE 10 % EX SWAB: 2.0000 | Freq: Once | CUTANEOUS | Status: DC

## 2024-02-24 MED ORDER — PROPOFOL 10 MG/ML IV BOLUS
INTRAVENOUS | Status: DC | PRN
  Administered 2024-02-24: 150 mg via INTRAVENOUS

## 2024-02-24 MED ORDER — CHLORHEXIDINE GLUCONATE 0.12 % MT SOLN
OROMUCOSAL | Status: DC
Start: 2024-02-24 — End: 2024-02-24
  Filled 2024-02-24: qty 15

## 2024-02-24 MED ORDER — AMISULPRIDE (ANTIEMETIC) 5 MG/2ML IV SOLN: 10.0000 mg | Freq: Once | INTRAVENOUS | Status: DC | PRN

## 2024-02-24 MED ORDER — SODIUM CHLORIDE 0.9 % IR SOLN
Status: DC | PRN
  Administered 2024-02-24: 1000 mL via INTRAVESICAL

## 2024-02-24 MED ORDER — PROPOFOL 10 MG/ML IV BOLUS
INTRAVENOUS | Status: AC
  Filled 2024-02-24: qty 20

## 2024-02-24 MED ORDER — GLYCOPYRROLATE PF 0.2 MG/ML IJ SOSY
PREFILLED_SYRINGE | INTRAMUSCULAR | Status: AC
  Filled 2024-02-24: qty 1

## 2024-02-24 MED ORDER — METRONIDAZOLE 500 MG/100ML IV SOLN
500.0000 mg | INTRAVENOUS | Status: AC
  Administered 2024-02-24: 500 mg via INTRAVENOUS

## 2024-02-24 MED ORDER — DEXAMETHASONE SODIUM PHOSPHATE 10 MG/ML IJ SOLN
INTRAMUSCULAR | Status: DC | PRN
  Administered 2024-02-24: 10 mg via INTRAVENOUS

## 2024-02-24 MED ORDER — SUGAMMADEX SODIUM 200 MG/2ML IV SOLN
INTRAVENOUS | Status: AC
  Filled 2024-02-24: qty 2

## 2024-02-24 MED ORDER — LACTATED RINGERS IV SOLN: INTRAVENOUS | Status: DC

## 2024-02-24 MED ORDER — MIDAZOLAM HCL 5 MG/5ML IJ SOLN
INTRAMUSCULAR | Status: DC | PRN
Start: 2024-02-24 — End: 2024-02-24
  Administered 2024-02-24: 2 mg via INTRAVENOUS

## 2024-02-24 MED ORDER — FENTANYL CITRATE (PF) 100 MCG/2ML IJ SOLN
INTRAMUSCULAR | Status: DC | PRN
  Administered 2024-02-24: 50 ug via INTRAVENOUS
  Administered 2024-02-24: 100 ug via INTRAVENOUS

## 2024-02-24 MED ORDER — BUPIVACAINE HCL (PF) 0.25 % IJ SOLN
INTRAMUSCULAR | Status: DC | PRN
Start: 2024-02-24 — End: 2024-02-24
  Administered 2024-02-24: 30 mL

## 2024-02-24 MED ORDER — ACETAMINOPHEN 500 MG PO TABS
ORAL_TABLET | ORAL | Status: AC
  Filled 2024-02-24: qty 2

## 2024-02-24 MED ORDER — LIDOCAINE 2% (20 MG/ML) 5 ML SYRINGE
INTRAMUSCULAR | Status: AC
  Filled 2024-02-24: qty 5

## 2024-02-24 MED ORDER — SUGAMMADEX SODIUM 200 MG/2ML IV SOLN
INTRAVENOUS | Status: DC | PRN
  Administered 2024-02-24: 200 mg via INTRAVENOUS

## 2024-02-24 MED ORDER — FENTANYL CITRATE (PF) 100 MCG/2ML IJ SOLN: 25.0000 ug | INTRAMUSCULAR | Status: DC | PRN

## 2024-02-24 MED ORDER — EPHEDRINE 5 MG/ML INJ
INTRAVENOUS | Status: AC
  Filled 2024-02-24: qty 5

## 2024-02-24 MED ORDER — ONDANSETRON HCL 4 MG/2ML IJ SOLN
INTRAMUSCULAR | Status: DC | PRN
  Administered 2024-02-24 (×2): 2 mg via INTRAVENOUS

## 2024-02-24 MED ORDER — SODIUM CHLORIDE 0.9 % IV SOLN: 12.5000 mg | INTRAVENOUS | Status: DC | PRN

## 2024-02-24 MED ORDER — FENTANYL CITRATE (PF) 250 MCG/5ML IJ SOLN
INTRAMUSCULAR | Status: AC
  Filled 2024-02-24: qty 5

## 2024-02-24 MED ORDER — ROCURONIUM BROMIDE 10 MG/ML (PF) SYRINGE
PREFILLED_SYRINGE | INTRAVENOUS | Status: AC
  Filled 2024-02-24: qty 10

## 2024-02-24 MED ORDER — DEXAMETHASONE SODIUM PHOSPHATE 10 MG/ML IJ SOLN
INTRAMUSCULAR | Status: AC
  Filled 2024-02-24: qty 1

## 2024-02-24 MED ORDER — ENOXAPARIN SODIUM 40 MG/0.4ML IJ SOSY
PREFILLED_SYRINGE | INTRAMUSCULAR | Status: DC
Start: 2024-02-24 — End: 2024-02-24
  Filled 2024-02-24: qty 0.4

## 2024-02-24 MED ORDER — MIDAZOLAM HCL 2 MG/2ML IJ SOLN
INTRAMUSCULAR | Status: AC
  Filled 2024-02-24: qty 2

## 2024-02-24 MED ORDER — KETOROLAC TROMETHAMINE 30 MG/ML IJ SOLN
INTRAMUSCULAR | Status: AC
  Filled 2024-02-24: qty 1

## 2024-02-24 MED ORDER — PHENAZOPYRIDINE HCL 100 MG PO TABS
ORAL_TABLET | ORAL | Status: AC
  Filled 2024-02-24: qty 2

## 2024-02-24 MED ORDER — CHLORHEXIDINE GLUCONATE 0.12 % MT SOLN
15.0000 mL | Freq: Once | OROMUCOSAL | Status: AC
  Administered 2024-02-24: 15 mL via OROMUCOSAL

## 2024-02-24 MED ORDER — CEFAZOLIN SODIUM-DEXTROSE 2-4 GM/100ML-% IV SOLN
2.0000 g | INTRAVENOUS | Status: AC
  Administered 2024-02-24: 2 g via INTRAVENOUS

## 2024-02-24 MED ORDER — ORAL CARE MOUTH RINSE: 15.0000 mL | Freq: Once | OROMUCOSAL | Status: AC

## 2024-02-24 MED ORDER — METRONIDAZOLE 500 MG/100ML IV SOLN
INTRAVENOUS | Status: AC
  Filled 2024-02-24: qty 100

## 2024-02-24 SURGICAL SUPPLY — 81 items
BAG URINE DRAIN 2000ML AR STRL (UROLOGICAL SUPPLIES) IMPLANT
BLADE CLIPPER SENSICLIP SURGIC (BLADE) ×1 IMPLANT
BLADE SURG 15 STRL LF DISP TIS (BLADE) ×1 IMPLANT
CATH FOLEY 3WAY 5CC 16FR (CATHETERS) ×1 IMPLANT
CHLORAPREP W/TINT 26 (MISCELLANEOUS) ×1 IMPLANT
COVER BACK TABLE 60X90IN (DRAPES) ×1 IMPLANT
COVER TIP SHEARS 8 DVNC (MISCELLANEOUS) ×1 IMPLANT
DEFOGGER SCOPE WARMER CLEARIFY (MISCELLANEOUS) ×1 IMPLANT
DERMABOND ADVANCED .7 DNX12 (GAUZE/BANDAGES/DRESSINGS) ×1 IMPLANT
DEVICE CAPIO SLIM SINGLE (INSTRUMENTS) IMPLANT
DRAPE ARM DVNC X/XI (DISPOSABLE) ×4 IMPLANT
DRAPE COLUMN DVNC XI (DISPOSABLE) ×1 IMPLANT
DRAPE SHEET LG 3/4 BI-LAMINATE (DRAPES) ×1 IMPLANT
DRAPE SURG IRRIG POUCH 19X23 (DRAPES) ×1 IMPLANT
DRAPE UTILITY XL STRL (DRAPES) ×1 IMPLANT
DRIVER NDL LRG 8 DVNC XI (INSTRUMENTS) IMPLANT
DRIVER NDL MEGA SUTCUT DVNCXI (INSTRUMENTS) IMPLANT
DRIVER NDLE LRG 8 DVNC XI (INSTRUMENTS) ×1 IMPLANT
DRIVER NDLE MEGA SUTCUT DVNCXI (INSTRUMENTS) ×1 IMPLANT
ELECT REM PT RETURN 9FT ADLT (ELECTROSURGICAL) ×1 IMPLANT
ELECTRODE REM PT RTRN 9FT ADLT (ELECTROSURGICAL) ×1 IMPLANT
FORCEPS BPLR 8 MD DVNC XI (FORCEP) IMPLANT
GAUZE 4X4 16PLY ~~LOC~~+RFID DBL (SPONGE) IMPLANT
GLOVE BIOGEL PI IND STRL 6.5 (GLOVE) ×1 IMPLANT
GLOVE ECLIPSE 6.0 STRL STRAW (GLOVE) ×3 IMPLANT
GLOVE SURG UNDER POLY LF SZ6.5 (GLOVE) ×4 IMPLANT
GOWN STRL REUS W/ TWL LRG LVL3 (GOWN DISPOSABLE) ×1 IMPLANT
GOWN STRL REUS W/TWL LRG LVL3 (GOWN DISPOSABLE) ×1 IMPLANT
GRASPER TIP-UP FEN DVNC XI (INSTRUMENTS) IMPLANT
HIBICLENS CHG 4% 4OZ BTL (MISCELLANEOUS) ×1 IMPLANT
HOLDER FOLEY CATH W/STRAP (MISCELLANEOUS) ×1 IMPLANT
IRRIG SUCT STRYKERFLOW 2 WTIP (MISCELLANEOUS) ×1 IMPLANT
IRRIGATION SUCT STRKRFLW 2 WTP (MISCELLANEOUS) ×1 IMPLANT
KIT PINK PAD W/HEAD ARE REST (MISCELLANEOUS) ×1 IMPLANT
KIT PINK PAD W/HEAD ARM REST (MISCELLANEOUS) ×1 IMPLANT
KIT TURNOVER KIT B (KITS) ×1 IMPLANT
LEGGING LITHOTOMY PAIR STRL (DRAPES) ×1 IMPLANT
MANIFOLD NEPTUNE II (INSTRUMENTS) ×1 IMPLANT
MANIPULATOR ADVINCU DEL 2.5 PL (MISCELLANEOUS) IMPLANT
MANIPULATOR ADVINCU DEL 3.0 PL (MISCELLANEOUS) IMPLANT
MANIPULATOR ADVINCU DEL 3.5 PL (MISCELLANEOUS) IMPLANT
MANIPULATOR ADVINCU DEL 4.0 PL (MISCELLANEOUS) IMPLANT
MESH VERTESSA LITE -Y 2X4X3 (Mesh General) ×1 IMPLANT
NDL HYPO 22X1.5 SAFETY MO (MISCELLANEOUS) ×1 IMPLANT
NDL INSUFFLATION 14GA 120MM (NEEDLE) ×1 IMPLANT
NEEDLE HYPO 22X1.5 SAFETY MO (MISCELLANEOUS) ×1 IMPLANT
NEEDLE INSUFFLATION 14GA 120MM (NEEDLE) ×1 IMPLANT
NS IRRIG 1000ML POUR BTL (IV SOLUTION) ×1 IMPLANT
OBTURATOR OPTICAL STND 8 DVNC (TROCAR) ×1 IMPLANT
OBTURATOR OPTICALSTD 8 DVNC (TROCAR) ×1 IMPLANT
PACK CYSTO (CUSTOM PROCEDURE TRAY) ×1 IMPLANT
PACK ROBOT WH (CUSTOM PROCEDURE TRAY) ×1 IMPLANT
PACK ROBOTIC GOWN (GOWN DISPOSABLE) ×1 IMPLANT
PACK VAGINAL WOMENS (CUSTOM PROCEDURE TRAY) ×1 IMPLANT
PAD OB MATERNITY 11 LF (PERSONAL CARE ITEMS) ×1 IMPLANT
PATTIES SURGICAL .5 X3 (DISPOSABLE) IMPLANT
RETRACTOR LONE STAR DISPOSABLE (INSTRUMENTS) ×1 IMPLANT
RETRACTOR STAY HOOK 5MM (MISCELLANEOUS) ×1 IMPLANT
SCISSORS MNPLR CVD DVNC XI (INSTRUMENTS) IMPLANT
SEAL UNIV 5-12 XI (MISCELLANEOUS) ×5 IMPLANT
SEALER VESSEL EXT DVNC XI (MISCELLANEOUS) IMPLANT
SET IRRIG Y TYPE TUR BLADDER L (SET/KITS/TRAYS/PACK) ×1 IMPLANT
SET TUBE SMOKE EVAC HIGH FLOW (TUBING) ×1 IMPLANT
SLEEVE SCD COMPRESS KNEE MED (STOCKING) ×1 IMPLANT
SPIKE FLUID TRANSFER (MISCELLANEOUS) ×1 IMPLANT
SUCTION TUBE FRAZIER 10FR DISP (SUCTIONS) ×1 IMPLANT
SURGIFLO W/THROMBIN 8M KIT (HEMOSTASIS) IMPLANT
SUT ABS MONO DBL WITH NDL 48IN (SUTURE) IMPLANT
SUT GORETEX NAB #0 THX26 36IN (SUTURE) ×2 IMPLANT
SUT MNCRL AB 4-0 PS2 18 (SUTURE) ×2 IMPLANT
SUT MON AB 2-0 SH 27 (SUTURE) ×1 IMPLANT
SUT STRATA PDS 2-0 23 CT-1 (SUTURE) ×2 IMPLANT
SUT V-LOC BARB 180 2/0GR9 GS23 (SUTURE) ×2 IMPLANT
SUT VIC AB 0 CT1 27XBRD ANBCTR (SUTURE) ×1 IMPLANT
SUT VIC AB 0 CT1 27XBRD ANTBC (SUTURE) IMPLANT
SUT VIC AB 2-0 SH 27XBRD (SUTURE) ×1 IMPLANT
SUT VLOC 180 0 9IN GS21 (SUTURE) ×1 IMPLANT
SUTURE V-LC BRB 180 2/0GR9GS23 (SUTURE) ×2 IMPLANT
SYR BULB EAR ULCER 3OZ GRN STR (SYRINGE) ×1 IMPLANT
TOWEL GREEN STERILE FF (TOWEL DISPOSABLE) ×1 IMPLANT
TRAY FOLEY W/BAG SLVR 14FR (SET/KITS/TRAYS/PACK) ×1 IMPLANT

## 2024-02-24 NOTE — Transfer of Care (Signed)
 Immediate Anesthesia Transfer of Care Note  Patient: Jodi Turner  Procedure(s) Performed: XI ROBOTIC ASSISTED TOTAL HYSTERECTOMY WITH BILATERAL SALPINGO-OOPHORECTOMY AND SACROCOLPOPEXY CYSTOSCOPY  Patient Location: PACU  Anesthesia Type:General  Level of Consciousness: drowsy and responds to stimulation  Airway & Oxygen Therapy: Patient Spontanous Breathing and Patient connected to face mask oxygen  Post-op Assessment: Report given to RN and Post -op Vital signs reviewed and stable  Post vital signs: Reviewed and stable  Last Vitals:  Vitals Value Taken Time  BP 177/87 02/24/24 1131  Temp    Pulse 67 02/24/24 1132  Resp 21 02/24/24 1132  SpO2 98 % 02/24/24 1132  Vitals shown include unfiled device data.  Last Pain:  Vitals:   02/24/24 0700  TempSrc: Oral  PainSc: 3       Patients Stated Pain Goal: 7 (02/24/24 0700)  Complications: No notable events documented.

## 2024-02-24 NOTE — Op Note (Signed)
 Operative Note  Preoperative Diagnosis: anterior vaginal prolapse, posterior vaginal prolapse, and uterovaginal prolapse, incomplete  Postoperative Diagnosis: same  Procedures performed:  Robotic assisted total laparoscopic hysterectomy with bilateral salpingo-oophorectomy, sacrocolpopexy Lorna Few Lite Y), cystoscopy  Implants:  Implant Name Type Inv. Item Serial No. Manufacturer Lot No. LRB No. Used Action  MESH Grayland Ormond [id number] Mesh General MESH Arlys John  [facility] MEDICAL [phone] N/A 1 Implanted    Attending Surgeon: Lanetta Inch, MD  Assistant: Donne Hazel, RNFA  Anesthesia: General endotracheal  Findings: 1. On vaginal exam, stage III prolapse present, antero-apical predominant  2. On cystoscopy, normal bladder and urethral mucosa without injury or lesion. Brisk bilateral ureteral efflux present.    3. On laparoscopy, adhesions were noted in the upper left abdomen. Normal appearing uterus, bilateral fallopian tubes and ovaries  Specimens:  ID Type Source Tests Collected by Time Destination  1 : Uterus cervix bilateral tubes and ovaries Tissue PATH Soft tissue SURGICAL PATHOLOGY Marguerita Beards, MD 02/24/2024 1000     Estimated blood loss: 50 mL  IV fluids: 200 mL  Urine output: 200 mL  Complications: none  Procedure in Detail:  After informed consent was obtained, the patient was taken to the operating room, where general anesthesia was induced and found to be adequate. She was placed in dorsolithotomy position in yellowfin stirrups. Her hips were noted not to be hyperflexed or hyperextended. Her arms were padded with gel pads and tucked to her sides. Her hands were surrounded by foam. A padded strap was placed across her chest with foam between the pad and her skin. She was noted to be appropriately positioned with all pressure points well padded and off tension. A tilt test showed no slippage. She was prepped and draped in  the usual sterile fashion. A uterine manipulator was placed in the uterus after sounding to 7 cm, an appropriately sized Koh ring was placed around the cervix, and a pneumo-occluder balloon was positioned in the vagina for later use.  A sterile Foley catheter was inserted.   0.25% plain Marcaine was injected in the supraumbilical  area and an 8 mm supraumbilical skin incision was made with the scalpel.  A Veress needle was inserted into the incision, CO2 insufflation was started, a low opening pressure was noted, and pneumoperitoneum was obtained. The Veress needle was removed and a 8mm robotic trocar was placed. The robotic camera was inserted and intraperitoneal placement was confirmed. Survey of the abdomen and pelvis revealed the findings as noted above. The sacrum appeared to be free of any adhesive disease. After determining placement for the other ports, Local anesthetic was injected at each site and two 8 mm incisions were made for robotic ports at 10 cm lateral to and at the level of the umbilical port. Two additional 8 mm incisions were made 10 cm lateral to these and 30 degrees down followed by 8 mm robotic ports - the right side for an assistant port. All trocars were placed sequentially under direct visualization of the camera. The patient was placed in Trendelenburg. The robot was docked on the patient's right side. Monopolar endoshears alternating with the vessel sealer was placed in the right arm, a [place] bipolar grasper was placed in the 2nd arm of the patient's left side, and a Tip up grasper was placed in the 3rd arm on the patient's left side.   Attention was then turned to the sacral promontory. The peritoneum overlying the sacral promontory was tented  up, dissected sharply with monopolar scissors and electrosurgery using layer by layer technique. The peritoneal incision was extended down to the posterior cul-de-sac. This was performed with care to avoid the ureter on the right side and  the sigmoid colon and its mesentary on the left side. Two transverse sutures of CV2 Gortex were placed in the anterior longitudinal ligament distal to the promontory.  Attention was then turned to the robotic hysterectomy and BSO. The ureter was identified and was found to be well away from the planned site of incision. Using the monopolar scissors, a window was made on the posterior leaf of the broad ligament. The infundibulopelvic ligament was cauterized and transected. The right round ligament was grasped, cauterized, and transected with electrocautery. The anterior and posterior leaves of the broad ligament were taken down with cautery and sharp dissection. The uterine artery was skeletonized and the bladder flap was created on the right side with a combination of electrosurgery and sharp dissection. The KOH ring was identified. The right uterine artery was clamped, cauterized, and transected. In a similar fashion, the left side was taken down. Further sharp dissection with combination of cautery was performed to further develop the bladder flap. At this point, the KOH ring was completely hugging the cervix. The pneumo-occluder balloon in the vagina was inflated to maintain pneumoperitoneum. A colpotomy was performed with electrosurgical cutting current and the uterus and cervix were completely amputated from the vagina. The specimen was delivered through the vagina. The posterior portion of the vaginal cuff was then grasped and pulled up to maintain pneumoperitoneum. The pneumo-occluder balloon was then replaced in the vagina. The right hand instrument was changed to a suture-cut needle driver. The vaginal cuff was then closed using a 0 V-loc suture in two layers.    The right hand instrument was replaced with monopolar endoshears. With a lucite probe in the vagina, the anterior vaginal dissection was then performed with sharp dissection and electrosurgery. The posterior vaginal dissection was then performed  with sharp dissection and electrosurgery in order to dissect the rectum away from the posterior vagina.  A "Y" mesh was then inserted into the abdomen after trimming to appropriate size. With the probe in the vagina, the anterior leaf of the Y mesh was affixed to the anterior portion of the vagina using a 2-0 v-loc suture in a spiral pattern to distribute the suture evenly across the surface of the anterior mesh leaf. In a similar fashion, the posterior leaf of the Y mesh was attached to the posterior surface of the vagina with 2-0 v-loc suture.  The distal end of the mesh was then brought to overlie the sacrum. The correct amount of tension was determined in order to elevate the vagina, but not put the mesh under tension. The distal end of the mesh was then affixed to the anterior longitudinal sacral ligament using the previously placed stitches of CV2 Gortex. The excess distal mesh was then cut and removed. The peritoneum was reapproximated over the mesh using 2-0 monocryl. The bladder flap was incorporated to completely retroperitonealize the mesh. All pedicles were carefully inspected and noted to be hemostatic as the CO2 gas was deflated. All instruments were removed from the patient's abdomen.   The Foley catheter was removed.  A 70-degree cystoscope was introduced, and 360-degree inspection revealed no injury, lesion or foreign body in the bladder. Brisk bilateral ureteral efflux was noted with the assistance of pyridium.  The bladder was drained and the cystoscope was removed.  The Foley catheter was replaced.  The robot was undocked. The CO2 gas was removed and the ports were removed.  The skin incisions were closed with subcutaneous stitches of 4-0 Monocryl and covered with skin glue.  Sponge, lap, and needle counts were correct x 2. The patient tolerated the procedure well. She was awakened from anesthesia and transferred to the recovery room in stable condition.   Marguerita Beards, MD

## 2024-02-24 NOTE — Discharge Instructions (Addendum)

## 2024-02-24 NOTE — Anesthesia Postprocedure Evaluation (Signed)
 Anesthesia Post Note  Patient: Jodi Turner  Procedure(s) Performed: XI ROBOTIC ASSISTED TOTAL HYSTERECTOMY WITH BILATERAL SALPINGO-OOPHORECTOMY AND SACROCOLPOPEXY CYSTOSCOPY     Patient location during evaluation: PACU Anesthesia Type: General Level of consciousness: awake and alert Pain management: pain level controlled Vital Signs Assessment: post-procedure vital signs reviewed and stable Respiratory status: spontaneous breathing, nonlabored ventilation and respiratory function stable Cardiovascular status: blood pressure returned to baseline and stable Postop Assessment: no apparent nausea or vomiting Anesthetic complications: no   No notable events documented.  Last Vitals:  Vitals:   02/24/24 1215 02/24/24 1230  BP: (!) 162/93 (!) 167/91  Pulse: 62 65  Resp: (!) 22 18  Temp:  36.4 C  SpO2: 96% 96%    Last Pain:  Vitals:   02/24/24 1230  TempSrc:   PainSc: 3                  Lowella Curb

## 2024-02-24 NOTE — Anesthesia Preprocedure Evaluation (Signed)
 Anesthesia Evaluation  Patient identified by MRN, date of birth, ID band Patient awake    Reviewed: Allergy & Precautions, H&P , NPO status , Patient's Chart, lab work & pertinent test results  Airway Mallampati: II  TM Distance: >3 FB Neck ROM: Full    Dental no notable dental hx.    Pulmonary COPD, former smoker   Pulmonary exam normal breath sounds clear to auscultation       Cardiovascular negative cardio ROS Normal cardiovascular exam Rhythm:Regular Rate:Normal     Neuro/Psych  Headaches  negative psych ROS   GI/Hepatic Neg liver ROS,GERD  ,,  Endo/Other  negative endocrine ROS    Renal/GU negative Renal ROS  negative genitourinary   Musculoskeletal negative musculoskeletal ROS (+)    Abdominal   Peds negative pediatric ROS (+)  Hematology negative hematology ROS (+)   Anesthesia Other Findings   Reproductive/Obstetrics negative OB ROS                             Anesthesia Physical Anesthesia Plan  ASA: 2  Anesthesia Plan: General   Post-op Pain Management: Tylenol PO (pre-op)*   Induction: Intravenous  PONV Risk Score and Plan: 3 and Ondansetron, Dexamethasone, Midazolam and Treatment may vary due to age or medical condition  Airway Management Planned: Oral ETT  Additional Equipment:   Intra-op Plan:   Post-operative Plan: Extubation in OR  Informed Consent: I have reviewed the patients History and Physical, chart, labs and discussed the procedure including the risks, benefits and alternatives for the proposed anesthesia with the patient or authorized representative who has indicated his/her understanding and acceptance.     Dental advisory given  Plan Discussed with: CRNA  Anesthesia Plan Comments:        Anesthesia Quick Evaluation

## 2024-02-24 NOTE — Interval H&P Note (Signed)
 History and Physical Interval Note:  02/24/2024 8:05 AM  Jodi Turner  has presented today for surgery, with the diagnosis of uterovaginal prolapse incomplete; stress urinary incontinence.  The various methods of treatment have been discussed with the patient and family. After consideration of risks, benefits and other options for treatment, the patient has consented to  Procedure(s) with comments: XI ROBOTIC ASSISTED TOTAL HYSTERECTOMY WITH BILATERAL SALPINGO-OOPHORECTOMY AND SACROCOLPOPEXY (N/A)  TRANSVAGINAL TAPE (TVT) PROCEDURE (possible) (N/A) CYSTOSCOPY (N/A) as a surgical intervention.  The patient's history has been reviewed, patient examined, no change in status, stable for surgery.  I have reviewed the patient's chart and labs.  Questions were answered to the patient's satisfaction.     Marguerita Beards

## 2024-02-24 NOTE — Anesthesia Procedure Notes (Signed)
 Procedure Name: Intubation Date/Time: 02/24/2024 8:42 AM  Performed by: Bishop Limbo, CRNAPre-anesthesia Checklist: Patient identified, Emergency Drugs available, Suction available and Patient being monitored Patient Re-evaluated:Patient Re-evaluated prior to induction Oxygen Delivery Method: Circle System Utilized Preoxygenation: Pre-oxygenation with 100% oxygen Induction Type: IV induction Ventilation: Mask ventilation without difficulty and Oral airway inserted - appropriate to patient size Laryngoscope Size: Mac and 3 Grade View: Grade I Tube type: Oral Tube size: 7.0 mm Number of attempts: 1 Airway Equipment and Method: Stylet and Oral airway Placement Confirmation: ETT inserted through vocal cords under direct vision, positive ETCO2 and breath sounds checked- equal and bilateral Secured at: 22 cm Tube secured with: Tape Dental Injury: Teeth and Oropharynx as per pre-operative assessment  Comments: Performed by Nils Pyle, SRNA

## 2024-02-25 ENCOUNTER — Telehealth: Payer: Self-pay | Admitting: Obstetrics and Gynecology

## 2024-02-25 ENCOUNTER — Encounter (HOSPITAL_COMMUNITY): Payer: Self-pay | Admitting: Obstetrics and Gynecology

## 2024-02-25 LAB — SURGICAL PATHOLOGY

## 2024-02-25 NOTE — Telephone Encounter (Signed)
 Jodi Turner  underwent Xi Robotic Assisted Total Hysterectomy With Bilateral Salpingo-oophorectomy And Sacrocolpopexy and Cystoscopy  on 02/24/2024  with [x] Dr Florian Buff [] Dr Olena Leatherwood.  The patient reports that her pain is not controlled.  She is taking [] No Medication [x] Acetaminophen 500mg  every 6 hours [x] Ibuprofen 600mg  every 6 hours or []  Prescribed Narcotic .  Her pain level [] with medication [] Without medication is .  Patient has been rotating medication since discharged, no pain She denies vaginal bleeding.  The patient is tolerating PO fluids and solids. She has not had a bowel movement and is not taking Miralax for a bowel regimen. She is not passing gas.  She was discharged without a catheter.   [x]  Discharged without a catheter, the patient does feel as if she is emptying her bladder.  She will not return for a voiding trial.  She does not having any additional questions.  Reviewed Post operative instructions as needed to answer additional questions.   CC'd note to patient's provider.

## 2024-02-25 NOTE — Telephone Encounter (Signed)
 Mauricio Po underwent Robotic assisted total laparoscopic hysterectomy with bilateral salpingo-oophorectomy, sacrocolpopexy Lorna Few Lite Y), cystoscopy on 02/24/24.   She passed her voiding trial.  was backfilled into the bladder Voided  PVR by bladder scan was 20ml.   She was discharged without a catheter. Please call her for a routine post op check. Thanks!  Marguerita Beards, MD

## 2024-04-04 ENCOUNTER — Ambulatory Visit (INDEPENDENT_AMBULATORY_CARE_PROVIDER_SITE_OTHER): Payer: 59 | Admitting: Obstetrics and Gynecology

## 2024-04-04 ENCOUNTER — Encounter: Payer: Self-pay | Admitting: Obstetrics and Gynecology

## 2024-04-04 VITALS — BP 124/65 | HR 82

## 2024-04-04 DIAGNOSIS — N3281 Overactive bladder: Secondary | ICD-10-CM

## 2024-04-04 DIAGNOSIS — N393 Stress incontinence (female) (male): Secondary | ICD-10-CM

## 2024-04-04 DIAGNOSIS — Z48816 Encounter for surgical aftercare following surgery on the genitourinary system: Secondary | ICD-10-CM

## 2024-04-04 DIAGNOSIS — Z9889 Other specified postprocedural states: Secondary | ICD-10-CM

## 2024-04-04 NOTE — Progress Notes (Signed)
 Jodi Turner  Date of Visit: 04/04/2024  History of Present Illness: Ms. Jodi Turner is a 63 y.o. female scheduled today for a post-operative visit.   Surgery: s/p Robotic assisted total laparoscopic hysterectomy with bilateral salpingo-oophorectomy, sacrocolpopexy Cleopatra Dada Lite Y), cystoscopy on 02/24/24  She passed her postoperative void trial.   Postoperative course has been uncomplicated.   Today she reports she is having some bladder leakage with urge. Drinks watered down sweet tea, sometimes a hot tea. Does not drink a lot of plain water .   UTI in the last 6 weeks? No  Pain? No  She has returned to her normal activity (except for postop restrictions) Vaginal bulge? No  Stress incontinence: Yes - a small amount with cough, sneeze, walking Urgency/frequency:  Has some urgency, esp in the morning, otherwise not at all  Urge incontinence: Yes - several times a day Voiding dysfunction: No  Bowel issues: Yes - has some outlet issues. Was previously taking the miralax  but then stopped. Getting better.   Subjective Success: Do you usually have a bulge or something falling out that you can see or feel in the vaginal area? No  Retreatment Success: Any retreatment with surgery or pessary for any compartment? No   Pathology results: UTERUS AND CERVIX, WITH BILATERAL FALLOPIAN TUBES AND OVARIES,  HYSTERECTOMY:  Cervix:           Unremarkable.            Negative for dysplasia or malignancy.        Endocervix:            Nabothian cysts.            Negative for hyperplasia, atypia or malignancy.        Endometrium:            Benign inactive endometrium.            Negative for hyperplasia, atypia or malignancy.        Myometrium:            Unremarkable.            Negative for malignancy.        Serosa:            Unremarkable.            Negative for malignancy.        Bilateral fallopian tubes:            Benign fimbriated fallopian tubes.             Negative for malignancy.        Bilateral ovaries:            Unremarkable.            Negative for malignancy   Medications: She has a current medication list which includes the following prescription(s): alprazolam, aspirin-acetaminophen -caffeine, calcium carbonate, rosuvastatin, acetaminophen , ibuprofen , and polyethylene glycol powder.   Allergies: Patient is allergic to hydrocodone, oxycodone, and tramadol.   Physical Exam: BP 124/65   Pulse 82   Abdomen: soft, non-tender, without masses or organomegaly Laparoscopic Incisions: healing well.  Pelvic Examination: Vagina: Incisions healing well. Sutures are present at the cuff and there is no granulation tissue. No tenderness along the anterior or posterior vagina. No apical tenderness. No pelvic masses. No visible or palpable mesh.  POP-Q: POP-Q  -3  Aa   -3                                           Ba  -8                                              C   2                                            Gh  4.5                                            Pb  8                                            tvl   -2.5                                            Ap  -2.5                                            Bp                                                 D    ---------------------------------------------------------  Assessment and Plan:  1. Post-operative state   2. Overactive bladder   3. SUI (stress urinary incontinence, female)    - Well healed.  - Pathology results were reviewed and is benign.  - Can resume regular activity.  Discussed avoidance of heavy lifting and straining long term to reduce the risk of recurrence. Wait an additional 6 weeks for intercourse.  - Discussed treating incontinence. She feels that it is improving overall and wants to wait a few more weeks.    Return in about 1 month (around 05/04/2024) for incontinence f/u.  Jodi Lamp,  MD

## 2024-05-04 ENCOUNTER — Ambulatory Visit (INDEPENDENT_AMBULATORY_CARE_PROVIDER_SITE_OTHER): Admitting: Obstetrics and Gynecology

## 2024-05-04 ENCOUNTER — Encounter: Payer: Self-pay | Admitting: Obstetrics and Gynecology

## 2024-05-04 VITALS — BP 114/77 | HR 87

## 2024-05-04 DIAGNOSIS — N393 Stress incontinence (female) (male): Secondary | ICD-10-CM

## 2024-05-04 DIAGNOSIS — N3281 Overactive bladder: Secondary | ICD-10-CM

## 2024-05-04 MED ORDER — MIRABEGRON ER 50 MG PO TB24: 50.0000 mg | ORAL_TABLET | Freq: Every day | ORAL | 5 refills | Status: AC

## 2024-05-04 NOTE — Progress Notes (Signed)
 [facility] Return Visit  SUBJECTIVE  History of Present Illness: Jodi Turner is a 63 Turner.o. female seen in follow-up for mixed incontinence.  She s/p Robotic assisted total laparoscopic hysterectomy with bilateral salpingo-oophorectomy, sacrocolpopexy Jodi Turner), cystoscopy on 02/24/24. She does not feel the incontinence has improved. Sometimes she will just have leakage out of nowhere. Has some leakage with cough/ sneeze but it is less.   Urodynamics showed overactive bladder. She has not previously tired medication.   Past Medical History: Patient  has a past medical history of Emphysema lung (HCC), GERD (gastroesophageal reflux disease), Hiatal hernia, History of kidney stones, History of transurethral surgical removal of ureterocele (03/2014), Migraines, SUI (stress urinary incontinence, female), Uterovaginal prolapse, incomplete, and Wears contact lenses.   Past Surgical History: She  has a past surgical history that includes Tubal ligation (Bilateral, 1993); cysto with hydrodistension (N/A, 04/06/2014); Cystoscopy with urethral dilatation (N/A, 04/06/2014); Cystoscopy w/ retrogrades (Right, 04/06/2014); Xi robotic assisted total hysterectomy with sacrocolpopexy (N/A, 02/24/2024); and Cystoscopy (N/A, 02/24/2024).   Medications: She has a current medication list which includes the following prescription(s): acetaminophen , alprazolam, amoxicillin -clavulanate, aspirin-acetaminophen -caffeine, calcium carbonate, ibuprofen , mirabegron er, polyethylene glycol powder, and rosuvastatin.   Allergies: Patient is allergic to hydrocodone, oxycodone, and tramadol.   Social History: Patient  reports that she quit smoking about 6 years ago. Her smoking use included cigarettes. She started smoking about 51 years ago. She has a 33.8 pack-year smoking history. She has never used smokeless tobacco. She reports current alcohol use. She reports that she does not use drugs.      OBJECTIVE     Physical Exam: Vitals:   05/04/24 1408  BP: 114/77  Pulse: 87   Gen: No apparent distress, A&O x 3.  Detailed Urogynecologic Evaluation:  Deferred.    ASSESSMENT AND PLAN    Jodi Turner is a 63 Turner.o. with:  1. Overactive bladder   2. SUI (stress urinary incontinence, female)     - For OAB symptoms, we discussed options of pelvic PT or medication. She wants to try medication. Prescribed Myrbetriq 50mg .  - Will reassess SUI symptoms at next visit. We reviewed options of urethral bulking and sling.   F/u 6 weeks   Arma Lamp, MD

## 2024-06-16 ENCOUNTER — Ambulatory Visit: Admitting: Obstetrics and Gynecology

## 2024-06-17 ENCOUNTER — Other Ambulatory Visit: Payer: Self-pay | Admitting: Acute Care

## 2024-06-17 DIAGNOSIS — Z87891 Personal history of nicotine dependence: Secondary | ICD-10-CM

## 2024-06-17 DIAGNOSIS — Z122 Encounter for screening for malignant neoplasm of respiratory organs: Secondary | ICD-10-CM

## 2024-07-04 ENCOUNTER — Encounter: Payer: Self-pay | Admitting: Obstetrics and Gynecology

## 2024-07-04 ENCOUNTER — Ambulatory Visit (INDEPENDENT_AMBULATORY_CARE_PROVIDER_SITE_OTHER): Admitting: Obstetrics and Gynecology

## 2024-07-04 VITALS — BP 138/70

## 2024-07-04 DIAGNOSIS — N3281 Overactive bladder: Secondary | ICD-10-CM

## 2024-07-04 DIAGNOSIS — N393 Stress incontinence (female) (male): Secondary | ICD-10-CM

## 2024-07-04 NOTE — Progress Notes (Signed)
 [facility] Return Visit  SUBJECTIVE  History of Present Illness: Jodi Turner is a 63 y.o. female seen in follow-up for mixed incontinence.  She s/p Robotic assisted total laparoscopic hysterectomy with bilateral salpingo-oophorectomy, sacrocolpopexy Lucita Lite Y), cystoscopy on 02/24/24.   She has cut back on the tea and feels a lot less urgency. Has been taking the myrbetriq  for about 6 weeks. Only has to wear a liner. Has had some SUI with lifting, cough/ sneeze. This is only happening once a day, sometimes not every day.    Urodynamic Impression (12/29/23):  1. Sensation was increased; capacity was normal 2. Stress Incontinence was not demonstrated at normal pressures; 3. Detrusor Overactivity was demonstrated without leakage. 4. Emptying was dysfunctional with a normal PVR, a sustained detrusor contraction present,  abdominal straining not present, dyssynergic urethral sphincter activity on EMG  Past Medical History: Patient  has a past medical history of Emphysema lung (HCC), GERD (gastroesophageal reflux disease), Hiatal hernia, History of kidney stones, History of transurethral surgical removal of ureterocele (03/2014), Migraines, SUI (stress urinary incontinence, female), Uterovaginal prolapse, incomplete, and Wears contact lenses.   Past Surgical History: She  has a past surgical history that includes Tubal ligation (Bilateral, 1993); cysto with hydrodistension (N/A, 04/06/2014); Cystoscopy with urethral dilatation (N/A, 04/06/2014); Cystoscopy w/ retrogrades (Right, 04/06/2014); Xi robotic assisted total hysterectomy with sacrocolpopexy (N/A, 02/24/2024); and Cystoscopy (N/A, 02/24/2024).   Medications: She has a current medication list which includes the following prescription(s): acetaminophen , alprazolam, aspirin-acetaminophen -caffeine, calcium carbonate, mirabegron  er, omeprazole, rosuvastatin, ibuprofen , and polyethylene glycol powder.    Allergies: Patient is allergic to augmentin [amoxicillin -pot clavulanate], hydrocodone, oxycodone, and tramadol.   Social History: Patient  reports that she quit smoking about 6 years ago. Her smoking use included cigarettes. She started smoking about 51 years ago. She has a 33.8 pack-year smoking history. She has never used smokeless tobacco. She reports current alcohol use. She reports that she does not use drugs.     OBJECTIVE     Physical Exam: Vitals:   07/04/24 0914  BP: 138/70    Gen: No apparent distress, A&O x 3.  Detailed Urogynecologic Evaluation:  Deferred.    ASSESSMENT AND PLAN    Ms. Waight is a 63 y.o. with:  1. SUI (stress urinary incontinence, female)   2. Overactive bladder     - For OAB symptoms, continue with Myrbetriq  50mg  - For SUI, reviewed options of pelvic PT, sling and urethral bulking. She wants to try pelvic PT first, referral placed. She may be interested in bulking in the office- handout provided for her to review.   F/u 3 months   Rosaline [name], MD  Time spent: I spent 18 minutes dedicated to the care of this patient on the date of this encounter to include pre-visit review of records, face-to-face time with the patient and post visit documentation.

## 2024-07-06 ENCOUNTER — Ambulatory Visit (HOSPITAL_BASED_OUTPATIENT_CLINIC_OR_DEPARTMENT_OTHER)
Admission: RE | Admit: 2024-07-06 | Discharge: 2024-07-06 | Disposition: A | Discharge status: Home / Self Care | Source: Ambulatory Visit | Attending: Acute Care | Admitting: Acute Care

## 2024-07-06 DIAGNOSIS — Z87891 Personal history of nicotine dependence: Secondary | ICD-10-CM | POA: Insufficient documentation

## 2024-07-06 DIAGNOSIS — Z122 Encounter for screening for malignant neoplasm of respiratory organs: Secondary | ICD-10-CM | POA: Diagnosis present

## 2024-07-18 ENCOUNTER — Other Ambulatory Visit: Payer: Self-pay

## 2024-07-18 DIAGNOSIS — Z87891 Personal history of nicotine dependence: Secondary | ICD-10-CM

## 2024-07-18 DIAGNOSIS — Z122 Encounter for screening for malignant neoplasm of respiratory organs: Secondary | ICD-10-CM

## 2024-08-02 ENCOUNTER — Encounter: Payer: Self-pay | Admitting: Obstetrics and Gynecology

## 2024-08-02 NOTE — Telephone Encounter (Signed)
 Patient stated she decided to go to an urgent care and to disregard her message.

## 2024-08-17 ENCOUNTER — Encounter: Payer: Self-pay | Admitting: Physical Therapy

## 2024-08-17 ENCOUNTER — Other Ambulatory Visit: Payer: Self-pay

## 2024-08-17 ENCOUNTER — Ambulatory Visit: Attending: Obstetrics and Gynecology | Admitting: Physical Therapy

## 2024-08-17 DIAGNOSIS — M6281 Muscle weakness (generalized): Secondary | ICD-10-CM | POA: Insufficient documentation

## 2024-08-17 DIAGNOSIS — R293 Abnormal posture: Secondary | ICD-10-CM | POA: Insufficient documentation

## 2024-08-17 DIAGNOSIS — R279 Unspecified lack of coordination: Secondary | ICD-10-CM | POA: Diagnosis present

## 2024-08-17 NOTE — Therapy (Signed)
 OUTPATIENT PHYSICAL THERAPY FEMALE PELVIC EVALUATION   Patient Name: Jodi Turner MRN: [id number] DOB:10/07/1961, 63 y.o., female Today's Date: 08/17/2024  END OF SESSION:  PT End of Session - 08/17/24 1201     Visit Number 1    Number of Visits 6    Date for PT Re-Evaluation 09/28/24    Authorization Type [employer]    PT Start Time [phone]    PT Stop Time 1100    PT Time Calculation (min) 45 min    Activity Tolerance Patient tolerated treatment well    Behavior During Therapy [facility] for tasks assessed/performed         Past Medical History:  Diagnosis Date   Emphysema lung (HCC)    HR Chest CT 06-12-2023   GERD (gastroesophageal reflux disease)    Hiatal hernia    History of kidney stones    History of transurethral surgical removal of ureterocele 03/2014   s/p  right incisional removal ureterocele in setting obstructive right uteteral stone   Migraines    SUI (stress urinary incontinence, female)    Uterovaginal prolapse, incomplete    Wears contact lenses    Past Surgical History:  Procedure Laterality Date   CYSTO WITH HYDRODISTENSION N/A 04/06/2014   Procedure: CYSTOSCOPY/HYDRODISTENSION OF BLADDER;  Surgeon: Glendia DELENA Elizabeth, MD;  Location: [facility];  Service: Urology;  Laterality: N/A;   CYSTOSCOPY N/A 02/24/2024   Procedure: CYSTOSCOPY;  Surgeon: Marilynne Rosaline SAILOR, MD;  Location: [facility] OR;  Service: Gynecology;  Laterality: N/A;   CYSTOSCOPY W/ RETROGRADES Right 04/06/2014   Procedure: CYSTOSCOPY WITH RETROGRADE PYELOGRAM, INCISION OF RIGHT URETEROCELE, REMOVAL OF RIGHT DISTAL STONE;  Surgeon: Glendia DELENA Elizabeth, MD;  Location: [facility] [place];  Service: Urology;  Laterality: Right;   CYSTOSCOPY WITH URETHRAL DILATATION N/A 04/06/2014   Procedure: CYSTOSCOPY WITH URETHRAL DILATATION;  Surgeon: Glendia DELENA Elizabeth, MD;  Location: [facility] [place];  Service: Urology;  Laterality: N/A;   TUBAL LIGATION Bilateral  1993   XI ROBOTIC ASSISTED TOTAL HYSTERECTOMY WITH SACROCOLPOPEXY N/A 02/24/2024   Procedure: XI ROBOTIC ASSISTED TOTAL HYSTERECTOMY WITH BILATERAL SALPINGO-OOPHORECTOMY AND SACROCOLPOPEXY;  Surgeon: Marilynne Rosaline SAILOR, MD;  Location: [facility] OR;  Service: Gynecology;  Laterality: N/A;  Total time requested is 3.5hrs.[facility]   Patient Active Problem List   Diagnosis Date Noted   Uterovaginal prolapse, incomplete 10/30/2023   Incomplete bladder emptying 10/30/2023   SUI (stress urinary incontinence, female) 10/30/2023   Smoker 03/16/2014   PCP: Cleotilde Planas, MD  REFERRING PROVIDER: Marilynne Rosaline SAILOR, MD  REFERRING DIAG: N39.3 (ICD-10-CM) - SUI (stress urinary incontinence, female)  THERAPY DIAG:  Muscle weakness (generalized)  Unspecified lack of coordination  Abnormal posture  Rationale for Evaluation and Treatment: Rehabilitation  ONSET DATE: unknown   SUBJECTIVE:  SUBJECTIVE STATEMENT: From eval: Pt reports to PFPT with urinary leakage that started after she had children. For a long time, she was unable to do jumping jacks or run due to leakage. She's s/p Robotic assisted total laparoscopic hysterectomy with bilateral salpingo-oophorectomy, sacrocolpopexy Lucita Lite Y), cystoscopy on 02/24/24. She is now leaking again and wishes to get this under control. She has cut back on the tea and feels a lot less urgency. Has been taking the myrbetriq  for about 6 weeks. Only has to wear a liner. Has had some SUI with lifting, cough/sneeze. This is only happening once a day, sometimes not every day.   Fluid intake: water  intake is low, enjoys tea   PAIN:  Are you having pain? No NPRS scale: 0/10  PRECAUTIONS: None  RED FLAGS: None   WEIGHT BEARING RESTRICTIONS: No  FALLS:  Has patient fallen in  last 6 months? 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, stays busy   ACTIVITY LEVEL: Zumba on Tuesdays, Tai Chi on Wednesdays, wishes to get back into walking   PLOF: Independent  PATIENT GOALS: to not have to worry about peeing on self   BOWEL MOVEMENT: no issues  Pain with bowel movement: No Type of bowel movement:Type (Bristol Stool Scale) 4, Frequency within normal limits , Strain no, and Splinting no Fully empty rectum: Yes:   Leakage: No Pads: No Fiber supplement/laxative No  URINATION: Pain with urination: No Fully empty bladder: Yes: but unsure if she is as good at this as she used to be  Stream: Strong Urgency: Yes  Frequency: within normal limits - maybe 1x/night  Leakage: Walking to the bathroom, Coughing, Sneezing, Laughing, Exercise, and Lifting Pads: Yes: poise pads 2-3 per day   INTERCOURSE:  Ability to have vaginal penetration No  Pain with intercourse: none DrynessNo Climax: yes Marinoff Scale: 0/3  PREGNANCY: Vaginal deliveries 3 Tearing No Episiotomy Yes  C-section deliveries 0 Currently pregnant No  PROLAPSE: None  OBJECTIVE:  Note: Objective measures were completed at Evaluation unless otherwise noted.  PATIENT SURVEYS:  PFIQ-7: 24  COGNITION: Overall cognitive status: Within functional limits for tasks assessed     SENSATION: Light touch: Appears intact  LUMBAR SPECIAL TESTS:  Single leg stance test: Positive  FUNCTIONAL TESTS:  Squat: bilateral dynamic knee valgus with loading, general lumbopelvic stiffness with loading   GAIT: Assistive device utilized: None Comments: mild trendelenburg gait pattern with ambulation   POSTURE: rounded shoulders and forward head  LUMBARAROM/PROM: within normal limits for all motions bilaterally with no pain   LOWER EXTREMITY ROM: within normal limits for all motions bilaterally with no pain   LOWER EXTREMITY MMT: 4/5 bilateral knees and hips grossly with no pain    PALPATION:   General: no tenderness to palpation of bilateral adductors or hip flexors in supine   Pelvic Alignment: within normal limits   Abdominal: upper chest breathing, abdominal bracing, and decrease lower rib excursion with inhalation                 External Perineal Exam: dryness present with limited clitoral hood mobility                              Internal Pelvic Floor: Pt fully consents to today's internal vaginal examination. She demonstrates low tone in bilateral aspects of superficial and deep pelvic floor musculature. She had no pain or palpable trigger points with today's exam. She  has a moderate pelvic floor contraction that is lacking full range of motion. When paired with exhalation, her ability to contract the pelvic floor improved, along with her coordination. No pain following examination.  Patient confirms identification and approves PT to assess internal pelvic floor and treatment Yes No emotional/communication barriers or cognitive limitation. Patient is motivated to learn. Patient understands and agrees with treatment goals and plan. PT explains patient will be examined in standing, sitting, and lying down to see how their muscles and joints work. When they are ready, they will be asked to remove their underwear so PT can examine their perineum. The patient is also given the option of providing their own chaperone as one is not provided in our facility. The patient also has the right and is explained the right to defer or refuse any part of the evaluation or treatment including the internal exam. With the patient's consent, PT will use one gloved finger to gently assess the muscles of the pelvic floor, seeing how well it contracts and relaxes and if there is muscle symmetry. After, the patient will get dressed and PT and patient will discuss exam findings and plan of care. PT and patient discuss plan of care, schedule, attendance policy and HEP activities.  PELVIC  MMT:   MMT eval  Vaginal 3/5, 10 quick flicks, 10 second hold   Internal Anal Sphincter   External Anal Sphincter   Puborectalis   Diastasis Recti   (Blank rows = not tested)       TONE: Low in bilateral aspects of superficial and deep pelvic floor musculature   PROLAPSE: None present in hooklying with cough test   TODAY'S TREATMENT:                                                                                                                              DATE:   EVAL 08/17/24: Examination completed, findings reviewed, pt educated on POC, HEP, and self care. Pt motivated to participate in PT and agreeable to attempt recommendations.   Neuro re-ed: Hooklying diaphragmatic breathing + pelvic floor lengthening with inhalation + shortening with exhalation 2x10  Hooklying pelvic floor quick flicks + diaphragmatic breathing 2x10  Self care: Relative anatomy and the connection between the diaphragm and pelvic floor, water  intake, bladder irritants, vaginal moisturizer samples provided   PATIENT EDUCATION:  Education details: Relative anatomy and the connection between the diaphragm and pelvic floor, water  intake, bladder irritants, vaginal moisturizer samples provided  Person educated: Patient Education method: Explanation, Demonstration, Tactile cues, Verbal cues, and Handouts Education comprehension: verbalized understanding, returned demonstration, verbal cues required, tactile cues required, and needs further education  HOME EXERCISE PROGRAM: Access Code: [email or web address] URL: [email or web address] Date: 08/17/2024 Prepared by: Celena Domino  Exercises - Supine Pelvic Floor Contraction  - 1 x daily - 7 x weekly - 3 sets - 10 reps - Quick Flick Pelvic Floor Contractions in Hooklying  - 1 x daily - 7 x  weekly - 3 sets - 10 reps  ASSESSMENT:  CLINICAL IMPRESSION: Patient is a 63 y.o. female  who was seen today for physical therapy evaluation and treatment for urinary  incontinence. She reports to PFPT with urinary leakage that started after she had children. For a long time, she was unable to do jumping jacks or run due to leakage. She s/p Robotic assisted total laparoscopic hysterectomy with bilateral salpingo-oophorectomy, sacrocolpopexy Lucita Lite Y), cystoscopy on 02/24/24. She is now leaking again and wishes to get this under control. She has cut back on the tea and feels a lot less urgency. Has been taking the myrbetriq  for about 6 weeks. Only has to wear a liner. Has had some SUI with lifting, cough/sneeze. This is only happening once a day, sometimes not every day. Pt fully consents to today's internal vaginal examination. She demonstrates low tone in bilateral aspects of superficial and deep pelvic floor musculature. She had no pain or palpable trigger points with today's exam. She has a moderate pelvic floor contraction that is lacking full range of motion. When paired with exhalation, her ability to contract the pelvic floor improved, along with her coordination. No pain following examination. Overall, pt tolerated session well and Pt would benefit from additional PT to further address deficits.    OBJECTIVE IMPAIRMENTS: decreased coordination, decreased endurance, decreased mobility, decreased ROM, and decreased strength.   ACTIVITY LIMITATIONS: continence  PARTICIPATION LIMITATIONS: community activity  PERSONAL FACTORS: Age, Past/current experiences, and Time since onset of injury/illness/exacerbation are also affecting patient's functional outcome.   REHAB POTENTIAL: Good  CLINICAL DECISION MAKING: Stable/uncomplicated  EVALUATION COMPLEXITY: Low   GOALS: Goals reviewed with patient? Yes  SHORT TERM GOALS: Target date: 09/14/2024  Pt will be independent with HEP.  Baseline: Goal status: INITIAL  2.  Pt will be independent with diaphragmatic breathing and down training activities in order to improve pelvic floor relaxation. Baseline:   Goal status: INITIAL  3.  Pt will be independent with the knack, urge suppression technique, and double voiding in order to improve bladder habits and decrease urinary incontinence.  Baseline:  Goal status: INITIAL  4.  Pt will be able to correctly perform diaphragmatic breathing and appropriate pressure management in order to prevent worsening vaginal wall laxity and improve pelvic floor A/ROM.  Baseline:  Goal status: INITIAL  LONG TERM GOALS: Target date: 02/14/2025  Pt will be independent with advanced HEP.  Baseline:  Goal status: INITIAL  2.  Pt to demonstrate improved coordination of pelvic floor and breathing mechanics with 10# squat with appropriate synergistic patterns to decrease pain and leakage at least 75% of the time for improved ability to complete a 30 minute workout with strain at pelvic floor and symptoms.   Baseline:  Goal status: INITIAL  3.  Pt will have to use 0-1 pads per day to maintain dryness in underwear and to decrease the need for excessive pad use throughout the day. Baseline:  Goal status: INITIAL  4.  Pt will report no leaks with laughing, coughing, sneezing in order to improve comfort with interpersonal relationships and community activities.   Baseline:  Goal status: INITIAL  PLAN:  PT FREQUENCY: 1-2x/week  PT DURATION: 6 months  PLANNED INTERVENTIONS: 97110-Therapeutic exercises, 97530- Therapeutic activity, 97112- Neuromuscular re-education, 97535- Self Care, [id number]- Manual therapy, Patient/Family education, Taping, Joint mobilization, Spinal mobilization, Scar mobilization, Cryotherapy, and Moist heat  PLAN FOR NEXT SESSION: continued pelvic floor AROM training in seated, introduce hip and core strengthening, stretching to lumbar  and hips, discuss toileting mechanics  Celena JAYSON Domino, PT 08/17/2024, 12:02 PM

## 2024-08-25 ENCOUNTER — Ambulatory Visit: Admitting: Physical Therapy

## 2024-08-25 DIAGNOSIS — M6281 Muscle weakness (generalized): Secondary | ICD-10-CM

## 2024-08-25 DIAGNOSIS — R279 Unspecified lack of coordination: Secondary | ICD-10-CM

## 2024-08-25 NOTE — Therapy (Signed)
 OUTPATIENT PHYSICAL THERAPY FEMALE PELVIC EVALUATION   Patient Name: Jodi Turner MRN: [id number] DOB:1961/07/22, 63 y.o., female Today's Date: 08/25/2024  END OF SESSION:  PT End of Session - 08/25/24 1238     Visit Number 2    Number of Visits 6    Date for Recertification  09/28/24    Authorization Type [employer]    PT Start Time 1145    PT Stop Time 1230    PT Time Calculation (min) 45 min    Activity Tolerance Patient tolerated treatment well    Behavior During Therapy [facility] for tasks assessed/performed          Past Medical History:  Diagnosis Date   Emphysema lung (HCC)    HR Chest CT 06-12-2023   GERD (gastroesophageal reflux disease)    Hiatal hernia    History of kidney stones    History of transurethral surgical removal of ureterocele 03/2014   s/p  right incisional removal ureterocele in setting obstructive right uteteral stone   Migraines    SUI (stress urinary incontinence, female)    Uterovaginal prolapse, incomplete    Wears contact lenses    Past Surgical History:  Procedure Laterality Date   CYSTO WITH HYDRODISTENSION N/A 04/06/2014   Procedure: CYSTOSCOPY/HYDRODISTENSION OF BLADDER;  Surgeon: Glendia DELENA Elizabeth, MD;  Location: [facility];  Service: Urology;  Laterality: N/A;   CYSTOSCOPY N/A 02/24/2024   Procedure: CYSTOSCOPY;  Surgeon: Marilynne Rosaline SAILOR, MD;  Location: [facility] OR;  Service: Gynecology;  Laterality: N/A;   CYSTOSCOPY W/ RETROGRADES Right 04/06/2014   Procedure: CYSTOSCOPY WITH RETROGRADE PYELOGRAM, INCISION OF RIGHT URETEROCELE, REMOVAL OF RIGHT DISTAL STONE;  Surgeon: Glendia DELENA Elizabeth, MD;  Location: [facility] [place];  Service: Urology;  Laterality: Right;   CYSTOSCOPY WITH URETHRAL DILATATION N/A 04/06/2014   Procedure: CYSTOSCOPY WITH URETHRAL DILATATION;  Surgeon: Glendia DELENA Elizabeth, MD;  Location: [facility];  Service: Urology;  Laterality: N/A;   TUBAL LIGATION  Bilateral 1993   XI ROBOTIC ASSISTED TOTAL HYSTERECTOMY WITH SACROCOLPOPEXY N/A 02/24/2024   Procedure: XI ROBOTIC ASSISTED TOTAL HYSTERECTOMY WITH BILATERAL SALPINGO-OOPHORECTOMY AND SACROCOLPOPEXY;  Surgeon: Marilynne Rosaline SAILOR, MD;  Location: [facility] OR;  Service: Gynecology;  Laterality: N/A;  Total time requested is 3.5hrs.[facility]   Patient Active Problem List   Diagnosis Date Noted   Uterovaginal prolapse, incomplete 10/30/2023   Incomplete bladder emptying 10/30/2023   SUI (stress urinary incontinence, female) 10/30/2023   Smoker 03/16/2014   PCP: Cleotilde Planas, MD  REFERRING PROVIDER: Marilynne Rosaline SAILOR, MD  REFERRING DIAG: N39.3 (ICD-10-CM) - SUI (stress urinary incontinence, female)  THERAPY DIAG:  Muscle weakness (generalized)  Unspecified lack of coordination  Rationale for Evaluation and Treatment: Rehabilitation  ONSET DATE: unknown   SUBJECTIVE:  SUBJECTIVE STATEMENT: She did her exercises 3-4 times this past week - she is trying to get into a routine. She has only leaked a little bit over the past two weeks. Sometimes she feels leakage randomly that is very small in amount. She just stopped taking Myrbetriq  on Monday to see if she can go without it, she has not noticed a difference since stopping.  From eval: Pt reports to PFPT with urinary leakage that started after she had children. For a long time, she was unable to do jumping jacks or run due to leakage. She's s/p Robotic assisted total laparoscopic hysterectomy with bilateral salpingo-oophorectomy, sacrocolpopexy Lucita Lite Y), cystoscopy on 02/24/24. She is now leaking again and wishes to get this under control. She has cut back on the tea and feels a lot less urgency. Has been taking the myrbetriq  for about 6 weeks. Only has to wear a  liner. Has had some SUI with lifting, cough/sneeze. This is only happening once a day, sometimes not every day.   Fluid intake: water  intake is low, enjoys tea   PAIN:  Are you having pain? No NPRS scale: 0/10  PRECAUTIONS: None  RED FLAGS: None   WEIGHT BEARING RESTRICTIONS: No  FALLS:  Has patient fallen in last 6 months? 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, stays busy   ACTIVITY LEVEL: Zumba on Tuesdays, Tai Chi on Wednesdays, wishes to get back into walking   PLOF: Independent  PATIENT GOALS: to not have to worry about peeing on self   BOWEL MOVEMENT: no issues  Pain with bowel movement: No Type of bowel movement:Type (Bristol Stool Scale) 4, Frequency within normal limits , Strain no, and Splinting no Fully empty rectum: Yes:   Leakage: No Pads: No Fiber supplement/laxative No  URINATION: Pain with urination: No Fully empty bladder: Yes: but unsure if she is as good at this as she used to be  Stream: Strong Urgency: Yes  Frequency: within normal limits - maybe 1x/night  Leakage: Walking to the bathroom, Coughing, Sneezing, Laughing, Exercise, and Lifting Pads: Yes: poise pads 2-3 per day   INTERCOURSE:  Ability to have vaginal penetration No  Pain with intercourse: none DrynessNo Climax: yes Marinoff Scale: 0/3  PREGNANCY: Vaginal deliveries 3 Tearing No Episiotomy Yes  C-section deliveries 0 Currently pregnant No  PROLAPSE: None  OBJECTIVE:  Note: Objective measures were completed at Evaluation unless otherwise noted.  PATIENT SURVEYS:  PFIQ-7: 24  COGNITION: Overall cognitive status: Within functional limits for tasks assessed     SENSATION: Light touch: Appears intact  LUMBAR SPECIAL TESTS:  Single leg stance test: Positive  FUNCTIONAL TESTS:  Squat: bilateral dynamic knee valgus with loading, general lumbopelvic stiffness with loading   GAIT: Assistive device utilized: None Comments: mild trendelenburg gait  pattern with ambulation   POSTURE: rounded shoulders and forward head  LUMBARAROM/PROM: within normal limits for all motions bilaterally with no pain   LOWER EXTREMITY ROM: within normal limits for all motions bilaterally with no pain   LOWER EXTREMITY MMT: 4/5 bilateral knees and hips grossly with no pain   PALPATION:   General: no tenderness to palpation of bilateral adductors or hip flexors in supine   Pelvic Alignment: within normal limits   Abdominal: upper chest breathing, abdominal bracing, and decrease lower rib excursion with inhalation                 External Perineal Exam: dryness present with limited clitoral hood mobility  Internal Pelvic Floor: Pt fully consents to today's internal vaginal examination. She demonstrates low tone in bilateral aspects of superficial and deep pelvic floor musculature. She had no pain or palpable trigger points with today's exam. She has a moderate pelvic floor contraction that is lacking full range of motion. When paired with exhalation, her ability to contract the pelvic floor improved, along with her coordination. No pain following examination.  Patient confirms identification and approves PT to assess internal pelvic floor and treatment Yes No emotional/communication barriers or cognitive limitation. Patient is motivated to learn. Patient understands and agrees with treatment goals and plan. PT explains patient will be examined in standing, sitting, and lying down to see how their muscles and joints work. When they are ready, they will be asked to remove their underwear so PT can examine their perineum. The patient is also given the option of providing their own chaperone as one is not provided in our facility. The patient also has the right and is explained the right to defer or refuse any part of the evaluation or treatment including the internal exam. With the patient's consent, PT will use one gloved finger to  gently assess the muscles of the pelvic floor, seeing how well it contracts and relaxes and if there is muscle symmetry. After, the patient will get dressed and PT and patient will discuss exam findings and plan of care. PT and patient discuss plan of care, schedule, attendance policy and HEP activities.  PELVIC MMT:   MMT eval  Vaginal 3/5, 10 quick flicks, 10 second hold   Internal Anal Sphincter   External Anal Sphincter   Puborectalis   Diastasis Recti   (Blank rows = not tested)       TONE: Low in bilateral aspects of superficial and deep pelvic floor musculature   PROLAPSE: None present in hooklying with cough test   TODAY'S TREATMENT:                                                                                                                              DATE:   EVAL 08/17/24: Examination completed, findings reviewed, pt educated on POC, HEP, and self care. Pt motivated to participate in PT and agreeable to attempt recommendations.   Neuro re-ed: Hooklying diaphragmatic breathing + pelvic floor lengthening with inhalation + shortening with exhalation 2x10  Hooklying pelvic floor quick flicks + diaphragmatic breathing 2x10  Self care: Relative anatomy and the connection between the diaphragm and pelvic floor, water  intake, bladder irritants, vaginal moisturizer samples provided   08/25/24: Seated pelvic floor contraction + inhalation with pelvic floor lengthening + exhalation with pelvic floor contraction 2x10  Seated pelvic floor quick flick contractions + diaphragmatic breathing 2x10  Sit to stand + adductor ball squeeze + diaphragmatic breathing 2x10  Bridge + adductor ball squeeze + diaphragmatic breathing 2x10  Sidelying clamshell + reverse clamshell + diaphragmatic breathing 2x10   PATIENT EDUCATION:  Education details: Relative anatomy  and the connection between the diaphragm and pelvic floor, water  intake, bladder irritants, vaginal moisturizer samples provided   Person educated: Patient Education method: Explanation, Demonstration, Tactile cues, Verbal cues, and Handouts Education comprehension: verbalized understanding, returned demonstration, verbal cues required, tactile cues required, and needs further education  HOME EXERCISE PROGRAM: Access Code: [email or web address] URL: [email or web address] Date: 08/25/2024 Prepared by: Celena Domino  Exercises - Seated Pelvic Floor Contraction  - 1 x daily - 7 x weekly - 2 sets - 10 reps - Seated Quick Flick Pelvic Floor Contractions  - 1 x daily - 7 x weekly - 2 sets - 10 reps - Sit to Stand with Mercer Between Knees  - 1 x daily - 7 x weekly - 2 sets - 10 reps - Supine Bridge with Mini Swiss Ball Between Knees  - 1 x daily - 7 x weekly - 2 sets - 10 reps - Clamshell  - 1 x daily - 7 x weekly - 2 sets - 10 reps - Sidelying Reverse Clamshell  - 1 x daily - 7 x weekly - 2 sets - 10 reps  ASSESSMENT:  CLINICAL IMPRESSION: Patient is a 63 y.o. female  who was seen today for physical therapy treatment for urinary incontinence. Patient reports no leakage since evaluation, and she has only done her exercises a few times since she was here last. We reviewed initial HEP and progressed her pelvic floor training to a higher form of gravitational load. Gluteal strengthening and hip strengthening introduced today with emphasis on diaphragmatic breathing to manage pelvic floor pressure. Overall, pt tolerated session well and Pt would benefit from additional PT to further address deficits.    OBJECTIVE IMPAIRMENTS: decreased coordination, decreased endurance, decreased mobility, decreased ROM, and decreased strength.   ACTIVITY LIMITATIONS: continence  PARTICIPATION LIMITATIONS: community activity  PERSONAL FACTORS: Age, Past/current experiences, and Time since onset of injury/illness/exacerbation are also affecting patient's functional outcome.   REHAB POTENTIAL: Good  CLINICAL DECISION MAKING:  Stable/uncomplicated  EVALUATION COMPLEXITY: Low   GOALS: Goals reviewed with patient? Yes  SHORT TERM GOALS: Target date: 09/14/2024  Pt will be independent with HEP.  Baseline: Goal status: INITIAL  2.  Pt will be independent with diaphragmatic breathing and down training activities in order to improve pelvic floor relaxation. Baseline:  Goal status: INITIAL  3.  Pt will be independent with the knack, urge suppression technique, and double voiding in order to improve bladder habits and decrease urinary incontinence.  Baseline:  Goal status: INITIAL  4.  Pt will be able to correctly perform diaphragmatic breathing and appropriate pressure management in order to prevent worsening vaginal wall laxity and improve pelvic floor A/ROM.  Baseline:  Goal status: INITIAL  LONG TERM GOALS: Target date: 02/14/2025  Pt will be independent with advanced HEP.  Baseline:  Goal status: INITIAL  2.  Pt to demonstrate improved coordination of pelvic floor and breathing mechanics with 10# squat with appropriate synergistic patterns to decrease pain and leakage at least 75% of the time for improved ability to complete a 30 minute workout with strain at pelvic floor and symptoms.   Baseline:  Goal status: INITIAL  3.  Pt will have to use 0-1 pads per day to maintain dryness in underwear and to decrease the need for excessive pad use throughout the day. Baseline:  Goal status: INITIAL  4.  Pt will report no leaks with laughing, coughing, sneezing in order to improve comfort with interpersonal relationships and community activities.   Baseline:  Goal status: INITIAL  PLAN:  PT FREQUENCY: 1-2x/week  PT DURATION: 6 months  PLANNED INTERVENTIONS: 97110-Therapeutic exercises, 97530- Therapeutic activity, 97112- Neuromuscular re-education, 97535- Self Care, [id number]- Manual therapy, Patient/Family education, Taping, Joint mobilization, Spinal mobilization, Scar mobilization, Cryotherapy, and Moist  heat  PLAN FOR NEXT SESSION: continued pelvic floor AROM training in standing, introduce deep core strengthening, stretching to lumbar and hips, discuss toileting mechanics and knack/urge drill   Celena JAYSON Domino, PT 08/25/2024, 12:38 PM

## 2024-09-01 ENCOUNTER — Ambulatory Visit: Payer: Self-pay | Admitting: Physical Therapy

## 2024-09-01 DIAGNOSIS — M6281 Muscle weakness (generalized): Secondary | ICD-10-CM | POA: Diagnosis not present

## 2024-09-01 DIAGNOSIS — R293 Abnormal posture: Secondary | ICD-10-CM

## 2024-09-01 DIAGNOSIS — R279 Unspecified lack of coordination: Secondary | ICD-10-CM

## 2024-09-01 NOTE — Therapy (Signed)
 OUTPATIENT PHYSICAL THERAPY FEMALE PELVIC TREATMENT   Patient Name: Jodi Turner MRN: [id number] DOB:[birth year], 63 y.o., female Today's Date: 09/01/2024  END OF SESSION:  PT End of Session - 09/01/24 1002     Visit Number 3    Number of Visits 6    Date for Recertification  09/28/24    Authorization Type [employer]    PT Start Time [phone]    PT Stop Time 1003    PT Time Calculation (min) 34 min    Activity Tolerance Patient tolerated treatment well    Behavior During Therapy [facility] for tasks assessed/performed           Past Medical History:  Diagnosis Date   Emphysema lung (HCC)    HR Chest CT 06-12-2023   GERD (gastroesophageal reflux disease)    Hiatal hernia    History of kidney stones    History of transurethral surgical removal of ureterocele 03/2014   s/p  right incisional removal ureterocele in setting obstructive right uteteral stone   Migraines    SUI (stress urinary incontinence, female)    Uterovaginal prolapse, incomplete    Wears contact lenses    Past Surgical History:  Procedure Laterality Date   CYSTO WITH HYDRODISTENSION N/A 04/06/2014   Procedure: CYSTOSCOPY/HYDRODISTENSION OF BLADDER;  Surgeon: Glendia DELENA Elizabeth, MD;  Location: [facility];  Service: Urology;  Laterality: N/A;   CYSTOSCOPY N/A 02/24/2024   Procedure: CYSTOSCOPY;  Surgeon: Marilynne Rosaline SAILOR, MD;  Location: [facility] OR;  Service: Gynecology;  Laterality: N/A;   CYSTOSCOPY W/ RETROGRADES Right 04/06/2014   Procedure: CYSTOSCOPY WITH RETROGRADE PYELOGRAM, INCISION OF RIGHT URETEROCELE, REMOVAL OF RIGHT DISTAL STONE;  Surgeon: Glendia DELENA Elizabeth, MD;  Location: [facility];  Service: Urology;  Laterality: Right;   CYSTOSCOPY WITH URETHRAL DILATATION N/A 04/06/2014   Procedure: CYSTOSCOPY WITH URETHRAL DILATATION;  Surgeon: Glendia DELENA Elizabeth, MD;  Location: [facility] ;  Service: Urology;  Laterality: N/A;   TUBAL LIGATION  Bilateral 1993   XI ROBOTIC ASSISTED TOTAL HYSTERECTOMY WITH SACROCOLPOPEXY N/A 02/24/2024   Procedure: XI ROBOTIC ASSISTED TOTAL HYSTERECTOMY WITH BILATERAL SALPINGO-OOPHORECTOMY AND SACROCOLPOPEXY;  Surgeon: Marilynne Rosaline SAILOR, MD;  Location: [facility] OR;  Service: Gynecology;  Laterality: N/A;  Total time requested is 3.5hrs.[facility]   Patient Active Problem List   Diagnosis Date Noted   Uterovaginal prolapse, incomplete 10/30/2023   Incomplete bladder emptying 10/30/2023   SUI (stress urinary incontinence, female) 10/30/2023   Smoker 03/16/2014   PCP: Cleotilde Planas, MD  REFERRING PROVIDER: Marilynne Rosaline SAILOR, MD  REFERRING DIAG: N39.3 (ICD-10-CM) - SUI (stress urinary incontinence, female)  THERAPY DIAG:  Muscle weakness (generalized)  Unspecified lack of coordination  Abnormal posture  Rationale for Evaluation and Treatment: Rehabilitation  ONSET DATE: unknown   SUBJECTIVE:  SUBJECTIVE STATEMENT: She reports that she is doing well today, going to the beach tomorrow. She has been semi-consistent with her HEP. She had leakage this week but it was less. If her bladder is full and she forcefully coughs/sneezes she will leak. Her leakage amount is the size of a thimble. She has continued without the Myrbetriq  and is doing okay. She feels like she is emptying her bowels more effectively. No pelvic pain to report.   From eval: Pt reports to PFPT with urinary leakage that started after she had children. For a long time, she was unable to do jumping jacks or run due to leakage. She's s/p Robotic assisted total laparoscopic hysterectomy with bilateral salpingo-oophorectomy, sacrocolpopexy Lucita Lite Y), cystoscopy on 02/24/24. She is now leaking again and wishes to get this under control. She has cut back on the  tea and feels a lot less urgency. Has been taking the myrbetriq  for about 6 weeks. Only has to wear a liner. Has had some SUI with lifting, cough/sneeze. This is only happening once a day, sometimes not every day.   Fluid intake: water  intake is low, enjoys tea   PAIN:  Are you having pain? No NPRS scale: 0/10  PRECAUTIONS: None  RED FLAGS: None   WEIGHT BEARING RESTRICTIONS: No  FALLS:  Has patient fallen in last 6 months? 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, stays busy   ACTIVITY LEVEL: Zumba on Tuesdays, Tai Chi on Wednesdays, wishes to get back into walking   PLOF: Independent  PATIENT GOALS: to not have to worry about peeing on self   BOWEL MOVEMENT: no issues  Pain with bowel movement: No Type of bowel movement:Type (Bristol Stool Scale) 4, Frequency within normal limits , Strain no, and Splinting no Fully empty rectum: Yes:   Leakage: No Pads: No Fiber supplement/laxative No  URINATION: Pain with urination: No Fully empty bladder: Yes: but unsure if she is as good at this as she used to be  Stream: Strong Urgency: Yes  Frequency: within normal limits - maybe 1x/night  Leakage: Walking to the bathroom, Coughing, Sneezing, Laughing, Exercise, and Lifting Pads: Yes: poise pads 2-3 per day   INTERCOURSE:  Ability to have vaginal penetration No  Pain with intercourse: none DrynessNo Climax: yes Marinoff Scale: 0/3  PREGNANCY: Vaginal deliveries 3 Tearing No Episiotomy Yes  C-section deliveries 0 Currently pregnant No  PROLAPSE: None  OBJECTIVE:  Note: Objective measures were completed at Evaluation unless otherwise noted.  PATIENT SURVEYS:  PFIQ-7: 24  COGNITION: Overall cognitive status: Within functional limits for tasks assessed     SENSATION: Light touch: Appears intact  LUMBAR SPECIAL TESTS:  Single leg stance test: Positive  FUNCTIONAL TESTS:  Squat: bilateral dynamic knee valgus with loading, general lumbopelvic  stiffness with loading   GAIT: Assistive device utilized: None Comments: mild trendelenburg gait pattern with ambulation   POSTURE: rounded shoulders and forward head  LUMBARAROM/PROM: within normal limits for all motions bilaterally with no pain   LOWER EXTREMITY ROM: within normal limits for all motions bilaterally with no pain   LOWER EXTREMITY MMT: 4/5 bilateral knees and hips grossly with no pain   PALPATION:   General: no tenderness to palpation of bilateral adductors or hip flexors in supine   Pelvic Alignment: within normal limits   Abdominal: upper chest breathing, abdominal bracing, and decrease lower rib excursion with inhalation  External Perineal Exam: dryness present with limited clitoral hood mobility                              Internal Pelvic Floor: Pt fully consents to today's internal vaginal examination. She demonstrates low tone in bilateral aspects of superficial and deep pelvic floor musculature. She had no pain or palpable trigger points with today's exam. She has a moderate pelvic floor contraction that is lacking full range of motion. When paired with exhalation, her ability to contract the pelvic floor improved, along with her coordination. No pain following examination.  Patient confirms identification and approves PT to assess internal pelvic floor and treatment Yes No emotional/communication barriers or cognitive limitation. Patient is motivated to learn. Patient understands and agrees with treatment goals and plan. PT explains patient will be examined in standing, sitting, and lying down to see how their muscles and joints work. When they are ready, they will be asked to remove their underwear so PT can examine their perineum. The patient is also given the option of providing their own chaperone as one is not provided in our facility. The patient also has the right and is explained the right to defer or refuse any part of the evaluation or  treatment including the internal exam. With the patient's consent, PT will use one gloved finger to gently assess the muscles of the pelvic floor, seeing how well it contracts and relaxes and if there is muscle symmetry. After, the patient will get dressed and PT and patient will discuss exam findings and plan of care. PT and patient discuss plan of care, schedule, attendance policy and HEP activities.  PELVIC MMT:   MMT eval  Vaginal 3/5, 10 quick flicks, 10 second hold   Internal Anal Sphincter   External Anal Sphincter   Puborectalis   Diastasis Recti   (Blank rows = not tested)       TONE: Low in bilateral aspects of superficial and deep pelvic floor musculature   PROLAPSE: None present in hooklying with cough test   TODAY'S TREATMENT:                                                                                                                              DATE:   EVAL 08/17/24: Examination completed, findings reviewed, pt educated on POC, HEP, and self care. Pt motivated to participate in PT and agreeable to attempt recommendations.   Neuro re-ed: Hooklying diaphragmatic breathing + pelvic floor lengthening with inhalation + shortening with exhalation 2x10  Hooklying pelvic floor quick flicks + diaphragmatic breathing 2x10  Self care: Relative anatomy and the connection between the diaphragm and pelvic floor, water  intake, bladder irritants, vaginal moisturizer samples provided   08/25/24: Seated pelvic floor contraction + inhalation with pelvic floor lengthening + exhalation with pelvic floor contraction 2x10  Seated pelvic floor quick flick contractions + diaphragmatic breathing 2x10  Sit  to stand + adductor ball squeeze + diaphragmatic breathing 2x10  Bridge + adductor ball squeeze + diaphragmatic breathing 2x10  Sidelying clamshell + reverse clamshell + diaphragmatic breathing 2x10   09/01/24: Urge drill for managing urge urinary incontinence  Knack drill for managing  stress urinary incontinence  Review of seated pelvic floor quick flick contractions + diaphragmatic breathing  Education for patient on how to correctly perform a pelvic floor contraction without co-contraction from gluteals/adductors  Education for use of knack and urge drill with daily activities and tasks including  Sneezing / coughing/ laughing / lifting  When in the car and trying to get home with a high sense of urgency  When in the grocery store and trying to make it to the bathroom with a high sense of urgency   PATIENT EDUCATION:  Education details: Relative anatomy and the connection between the diaphragm and pelvic floor, water  intake, bladder irritants, vaginal moisturizer samples provided  Person educated: Patient Education method: Explanation, Demonstration, Tactile cues, Verbal cues, and Handouts Education comprehension: verbalized understanding, returned demonstration, verbal cues required, tactile cues required, and needs further education  HOME EXERCISE PROGRAM: Access Code: [email or web address] URL: [email or web address] Date: 08/25/2024 Prepared by: Celena Domino  Exercises - Seated Pelvic Floor Contraction  - 1 x daily - 7 x weekly - 2 sets - 10 reps - Seated Quick Flick Pelvic Floor Contractions  - 1 x daily - 7 x weekly - 2 sets - 10 reps - Sit to Stand with Mercer Between Knees  - 1 x daily - 7 x weekly - 2 sets - 10 reps - Supine Bridge with Mini Swiss Ball Between Knees  - 1 x daily - 7 x weekly - 2 sets - 10 reps - Clamshell  - 1 x daily - 7 x weekly - 2 sets - 10 reps - Sidelying Reverse Clamshell  - 1 x daily - 7 x weekly - 2 sets - 10 reps  ASSESSMENT:  CLINICAL IMPRESSION: Patient is a 63 y.o. female  who was seen today for physical therapy treatment for urinary incontinence. Patient reports minimal leakage and she has still been trying to establish a consistent pelvic floor routine at home. HEP reviewed and patient education on how to correctly perform  quick flick pelvic floor contractions. Knack technique and urge drill introduced to help pt manage urinary incontinence during ADLs and with specific social scenarios. Overall, pt tolerated session well and Pt would benefit from additional PT to further address deficits.    OBJECTIVE IMPAIRMENTS: decreased coordination, decreased endurance, decreased mobility, decreased ROM, and decreased strength.   ACTIVITY LIMITATIONS: continence  PARTICIPATION LIMITATIONS: community activity  PERSONAL FACTORS: Age, Past/current experiences, and Time since onset of injury/illness/exacerbation are also affecting patient's functional outcome.   REHAB POTENTIAL: Good  CLINICAL DECISION MAKING: Stable/uncomplicated  EVALUATION COMPLEXITY: Low   GOALS: Goals reviewed with patient? Yes  SHORT TERM GOALS: Target date: 09/14/2024  Pt will be independent with HEP.  Baseline: Goal status: INITIAL  2.  Pt will be independent with diaphragmatic breathing and down training activities in order to improve pelvic floor relaxation. Baseline:  Goal status: INITIAL  3.  Pt will be independent with the knack, urge suppression technique, and double voiding in order to improve bladder habits and decrease urinary incontinence.  Baseline:  Goal status: INITIAL  4.  Pt will be able to correctly perform diaphragmatic breathing and appropriate pressure management in order to prevent worsening vaginal wall laxity and improve pelvic floor A/ROM.  Baseline:  Goal status: INITIAL  LONG TERM GOALS: Target date: 02/14/2025  Pt will be independent with advanced HEP.  Baseline:  Goal status: INITIAL  2.  Pt to demonstrate improved coordination of pelvic floor and breathing mechanics with 10# squat with appropriate synergistic patterns to decrease pain and leakage at least 75% of the time for improved ability to complete a 30 minute workout with strain at pelvic floor and symptoms.   Baseline:  Goal status: INITIAL  3.   Pt will have to use 0-1 pads per day to maintain dryness in underwear and to decrease the need for excessive pad use throughout the day. Baseline:  Goal status: INITIAL  4.  Pt will report no leaks with laughing, coughing, sneezing in order to improve comfort with interpersonal relationships and community activities.   Baseline:  Goal status: INITIAL  PLAN:  PT FREQUENCY: 1-2x/week  PT DURATION: 6 months  PLANNED INTERVENTIONS: 97110-Therapeutic exercises, 97530- Therapeutic activity, 97112- Neuromuscular re-education, 97535- Self Care, [id number]- Manual therapy, Patient/Family education, Taping, Joint mobilization, Spinal mobilization, Scar mobilization, Cryotherapy, and Moist heat  PLAN FOR NEXT SESSION: continued pelvic floor AROM training in standing, introduce deep core strengthening, stretching to lumbar and hips, discuss toileting mechanics and knack/urge drill   Celena JAYSON Domino, PT 09/01/2024, 10:02 AM

## 2024-09-01 NOTE — Patient Instructions (Signed)
Urge Incontinence  Ideal urination frequency is every 2-4 wakeful hours, which equates to 5-8 times within a 24-hour period.   Urge incontinence is leakage that occurs when the bladder muscle contracts, creating a sudden need to go before getting to the bathroom.   Going too often when your bladder isn't actually full can disrupt the body's automatic signals to store and hold urine longer, which will increase urgency/frequency.  In this case, the bladder "is running the show" and strategies can be learned to retrain this pattern.   One should be able to control the first urge to urinate, at around 150mL.  The bladder can hold up to a "grande latte," or 400mL. To help you gain control, practice the Urge Drill below when urgency strikes.  This drill will help retrain your bladder signals and allow you to store and hold urine longer.  The overall goal is to stretch out your time between voids to reach a more manageable voiding schedule.    Practice your "quick flicks" often throughout the day (each waking hour) even when you don't need feel the urge to go.  This will help strengthen your pelvic floor muscles, making them more effective in controlling leakage.  Urge Drill  When you feel an urge to go, follow these steps to regain control: Stop what you are doing and be still Take one deep breath, directing your air into your abdomen Think an affirming thought, such as "I've got this." Do 5 quick flicks of your pelvic floor Walk with control to the bathroom to void, or delay voiding     THE KNACK  The Knack is a strategy you may use to help to reduce or prevent leakage or passing of urine, gas or feces during an activity that causes downward force on the pelvic floor muscles.    Activities that can cause downward pressure on the pelvic floor muscles include coughing, sneezing, laughing, bending, lifting, and transitioning from different body positions such as from laying down to sitting up and  sitting to standing.  To perform The Knack, consciously squeeze and lift your pelvic floor muscles to perform a strong, well-timed pelvic muscle contraction BEFORE AND DURING these activities above.  As your contraction gets more coordinated and your muscles get stronger, you will become more effective in controlling your experience of incontinence or gas passing during these activities.      

## 2024-09-08 ENCOUNTER — Ambulatory Visit: Payer: Self-pay | Attending: Obstetrics and Gynecology | Admitting: Physical Therapy

## 2024-09-08 DIAGNOSIS — R293 Abnormal posture: Secondary | ICD-10-CM | POA: Diagnosis present

## 2024-09-08 DIAGNOSIS — R279 Unspecified lack of coordination: Secondary | ICD-10-CM | POA: Diagnosis present

## 2024-09-08 DIAGNOSIS — M6281 Muscle weakness (generalized): Secondary | ICD-10-CM | POA: Insufficient documentation

## 2024-09-08 NOTE — Patient Instructions (Signed)
Double Voiding can be a very useful technique to help overcome incomplete emptying of your bladder.  Incomplete emptying of urine can result in leakage after using the bathroom and increase the risk of urinary tract infection.   Initial Void: When you first sit down to urinate, ensure optimal positioning for bladder emptying by following these guidelines for toileting posture: Sit on the toilet seat - don't hover over the seat Support your trunk by placing your hands on your knees or thighs Spread your knees and hips wide Position your feet flat on the floor or elevate feet on phone books, foot stool (Squatty Potty), or wrapped toilet paper rolls (if having knees above hips helps you empty) Lean forward from your hips Maintain the normal inward curve in your lower back   Repeated Void: After your initial void is complete, follow these movement patterns and attempt going to the bathroom again. Stand up Rotate your hips as if doing hula hoop in one direction Rotate using the same action in the other direction Rock your hips and pelvis back and forwards ("pelvic tilts") Rock your hips and pelvis side to side ("tail wag") Sit back down and repeat your voiding technique This technique can be repeated as many times as you choose to help you empty your bladder more effectively.  

## 2024-09-08 NOTE — Therapy (Signed)
 OUTPATIENT PHYSICAL THERAPY FEMALE PELVIC TREATMENT   Patient Name: Jodi Turner MRN: [id number] DOB:08-Mar-1961, 63 y.o., female Today's Date: 09/08/2024  END OF SESSION:  PT End of Session - 09/08/24 1137     Visit Number 4    Number of Visits 6    Date for Recertification  09/28/24    Authorization Type [employer]    PT Start Time 1100    PT Stop Time 1140    PT Time Calculation (min) 40 min    Activity Tolerance Patient tolerated treatment well    Behavior During Therapy [facility] for tasks assessed/performed            Past Medical History:  Diagnosis Date   Emphysema lung (HCC)    HR Chest CT 06-12-2023   GERD (gastroesophageal reflux disease)    Hiatal hernia    History of kidney stones    History of transurethral surgical removal of ureterocele 03/2014   s/p  right incisional removal ureterocele in setting obstructive right uteteral stone   Migraines    SUI (stress urinary incontinence, female)    Uterovaginal prolapse, incomplete    Wears contact lenses    Past Surgical History:  Procedure Laterality Date   CYSTO WITH HYDRODISTENSION N/A 04/06/2014   Procedure: CYSTOSCOPY/HYDRODISTENSION OF BLADDER;  Surgeon: Glendia DELENA Elizabeth, MD;  Location: [facility] [place];  Service: Urology;  Laterality: N/A;   CYSTOSCOPY N/A 02/24/2024   Procedure: CYSTOSCOPY;  Surgeon: Marilynne Rosaline SAILOR, MD;  Location: [facility] OR;  Service: Gynecology;  Laterality: N/A;   CYSTOSCOPY W/ RETROGRADES Right 04/06/2014   Procedure: CYSTOSCOPY WITH RETROGRADE PYELOGRAM, INCISION OF RIGHT URETEROCELE, REMOVAL OF RIGHT DISTAL STONE;  Surgeon: Glendia DELENA Elizabeth, MD;  Location: [facility];  Service: Urology;  Laterality: Right;   CYSTOSCOPY WITH URETHRAL DILATATION N/A 04/06/2014   Procedure: CYSTOSCOPY WITH URETHRAL DILATATION;  Surgeon: Glendia DELENA Elizabeth, MD;  Location: [facility] [place];  Service: Urology;  Laterality: N/A;   TUBAL LIGATION  Bilateral 1993   XI ROBOTIC ASSISTED TOTAL HYSTERECTOMY WITH SACROCOLPOPEXY N/A 02/24/2024   Procedure: XI ROBOTIC ASSISTED TOTAL HYSTERECTOMY WITH BILATERAL SALPINGO-OOPHORECTOMY AND SACROCOLPOPEXY;  Surgeon: Marilynne Rosaline SAILOR, MD;  Location: [facility] OR;  Service: Gynecology;  Laterality: N/A;  Total time requested is 3.5hrs.[facility]   Patient Active Problem List   Diagnosis Date Noted   Uterovaginal prolapse, incomplete 10/30/2023   Incomplete bladder emptying 10/30/2023   SUI (stress urinary incontinence, female) 10/30/2023   Smoker 03/16/2014   PCP: Cleotilde Planas, MD  REFERRING PROVIDER: Marilynne Rosaline SAILOR, MD  REFERRING DIAG: N39.3 (ICD-10-CM) - SUI (stress urinary incontinence, female)  THERAPY DIAG:  Muscle weakness (generalized)  Unspecified lack of coordination  Abnormal posture  Rationale for Evaluation and Treatment: Rehabilitation  ONSET DATE: unknown   SUBJECTIVE:  SUBJECTIVE STATEMENT: Patient reports that she has been practicing the knack technique and her exercises. Leakage is still present, but not as frequent and much less in nature. Her bladder has been spasming every day since she hasn't been taking her medication, is seeing Dr at the end of this month to discuss these spasms.   She reports that she is doing well today, going to the beach tomorrow. She has been semi-consistent with her HEP. She had leakage this week but it was less. If her bladder is full and she forcefully coughs/sneezes she will leak. Her leakage amount is the size of a thimble. She has continued without the Myrbetriq  and is doing okay. She feels like she is emptying her bowels more effectively. No pelvic pain to report.   From eval: Pt reports to PFPT with urinary leakage that started after she had children. For a  long time, she was unable to do jumping jacks or run due to leakage. She's s/p Robotic assisted total laparoscopic hysterectomy with bilateral salpingo-oophorectomy, sacrocolpopexy Lucita Lite Y), cystoscopy on 02/24/24. She is now leaking again and wishes to get this under control. She has cut back on the tea and feels a lot less urgency. Has been taking the myrbetriq  for about 6 weeks. Only has to wear a liner. Has had some SUI with lifting, cough/sneeze. This is only happening once a day, sometimes not every day.   Fluid intake: water  intake is low, enjoys tea   PAIN:  Are you having pain? No NPRS scale: 0/10  PRECAUTIONS: None  RED FLAGS: None   WEIGHT BEARING RESTRICTIONS: No  FALLS:  Has patient fallen in last 6 months? 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, stays busy   ACTIVITY LEVEL: Zumba on Tuesdays, Tai Chi on Wednesdays, wishes to get back into walking   PLOF: Independent  PATIENT GOALS: to not have to worry about peeing on self   BOWEL MOVEMENT: no issues  Pain with bowel movement: No Type of bowel movement:Type (Bristol Stool Scale) 4, Frequency within normal limits , Strain no, and Splinting no Fully empty rectum: Yes:   Leakage: No Pads: No Fiber supplement/laxative No  URINATION: Pain with urination: No Fully empty bladder: Yes: but unsure if she is as good at this as she used to be  Stream: Strong Urgency: Yes  Frequency: within normal limits - maybe 1x/night  Leakage: Walking to the bathroom, Coughing, Sneezing, Laughing, Exercise, and Lifting Pads: Yes: poise pads 2-3 per day   INTERCOURSE:  Ability to have vaginal penetration No  Pain with intercourse: none DrynessNo Climax: yes Marinoff Scale: 0/3  PREGNANCY: Vaginal deliveries 3 Tearing No Episiotomy Yes  C-section deliveries 0 Currently pregnant No  PROLAPSE: None  OBJECTIVE:  Note: Objective measures were completed at Evaluation unless otherwise  noted.  PATIENT SURVEYS:  PFIQ-7: 24  COGNITION: Overall cognitive status: Within functional limits for tasks assessed     SENSATION: Light touch: Appears intact  LUMBAR SPECIAL TESTS:  Single leg stance test: Positive  FUNCTIONAL TESTS:  Squat: bilateral dynamic knee valgus with loading, general lumbopelvic stiffness with loading   GAIT: Assistive device utilized: None Comments: mild trendelenburg gait pattern with ambulation   POSTURE: rounded shoulders and forward head  LUMBARAROM/PROM: within normal limits for all motions bilaterally with no pain   LOWER EXTREMITY ROM: within normal limits for all motions bilaterally with no pain   LOWER EXTREMITY MMT: 4/5 bilateral knees and hips grossly with no pain  PALPATION:   General: no tenderness to palpation of bilateral adductors or hip flexors in supine   Pelvic Alignment: within normal limits   Abdominal: upper chest breathing, abdominal bracing, and decrease lower rib excursion with inhalation                 External Perineal Exam: dryness present with limited clitoral hood mobility                              Internal Pelvic Floor: Pt fully consents to today's internal vaginal examination. She demonstrates low tone in bilateral aspects of superficial and deep pelvic floor musculature. She had no pain or palpable trigger points with today's exam. She has a moderate pelvic floor contraction that is lacking full range of motion. When paired with exhalation, her ability to contract the pelvic floor improved, along with her coordination. No pain following examination.  Patient confirms identification and approves PT to assess internal pelvic floor and treatment Yes No emotional/communication barriers or cognitive limitation. Patient is motivated to learn. Patient understands and agrees with treatment goals and plan. PT explains patient will be examined in standing, sitting, and lying down to see how their muscles and joints  work. When they are ready, they will be asked to remove their underwear so PT can examine their perineum. The patient is also given the option of providing their own chaperone as one is not provided in our facility. The patient also has the right and is explained the right to defer or refuse any part of the evaluation or treatment including the internal exam. With the patient's consent, PT will use one gloved finger to gently assess the muscles of the pelvic floor, seeing how well it contracts and relaxes and if there is muscle symmetry. After, the patient will get dressed and PT and patient will discuss exam findings and plan of care. PT and patient discuss plan of care, schedule, attendance policy and HEP activities.  PELVIC MMT:   MMT eval  Vaginal 3/5, 10 quick flicks, 10 second hold   Internal Anal Sphincter   External Anal Sphincter   Puborectalis   Diastasis Recti   (Blank rows = not tested)       TONE: Low in bilateral aspects of superficial and deep pelvic floor musculature   PROLAPSE: None present in hooklying with cough test   TODAY'S TREATMENT:                                                                                                                              DATE:   08/25/24: Seated pelvic floor contraction + inhalation with pelvic floor lengthening + exhalation with pelvic floor contraction 2x10  Seated pelvic floor quick flick contractions + diaphragmatic breathing 2x10  Sit to stand + adductor ball squeeze + diaphragmatic breathing 2x10  Bridge + adductor ball squeeze + diaphragmatic breathing 2x10  Sidelying clamshell +  reverse clamshell + diaphragmatic breathing 2x10   09/01/24: Urge drill for managing urge urinary incontinence  Knack drill for managing stress urinary incontinence  Review of seated pelvic floor quick flick contractions + diaphragmatic breathing  Education for patient on how to correctly perform a pelvic floor contraction without co-contraction  from gluteals/adductors  Education for use of knack and urge drill with daily activities and tasks including  Sneezing / coughing/ laughing / lifting  When in the car and trying to get home with a high sense of urgency  When in the grocery store and trying to make it to the bathroom with a high sense of urgency   09/08/24: Standing pelvic floor contraction + diaphragmatic breathing 2x10  Seated pelvic floor contraction + diaphragmatic breathing 2x10  Supine butterfly stretch + diaphragmatic breathing 2x78min  Single knee to chest stretch + diaphragmatic breathing 2x83min  Supine pretzel piriformis stretch + diaphragmatic breathing 2x76min  Lower trunk rotation + diaphragmatic breathing 2x33min Double voiding technique to ensure optimal bladder emptying   PATIENT EDUCATION:  Education details: Relative anatomy and the connection between the diaphragm and pelvic floor, water  intake, bladder irritants, vaginal moisturizer samples provided  Person educated: Patient Education method: Explanation, Demonstration, Tactile cues, Verbal cues, and Handouts Education comprehension: verbalized understanding, returned demonstration, verbal cues required, tactile cues required, and needs further education  HOME EXERCISE PROGRAM: Access Code: [email or web address] URL: [email or web address] Date: 09/08/2024 Prepared by: Celena Domino  Exercises - Seated Pelvic Floor Contraction  - 1 x daily - 7 x weekly - 2 sets - 10 reps - Standing Pelvic Floor Contraction  - 1 x daily - 7 x weekly - 2 sets - 10 reps - Seated Quick Flick Pelvic Floor Contractions  - 1 x daily - 7 x weekly - 2 sets - 10 reps - Sit to Stand with Mercer Between Knees  - 1 x daily - 7 x weekly - 2 sets - 10 reps - Supine Bridge with Mini Swiss Ball Between Knees  - 1 x daily - 7 x weekly - 2 sets - 10 reps - Clamshell  - 1 x daily - 7 x weekly - 2 sets - 10 reps - Sidelying Reverse Clamshell  - 1 x daily - 7 x weekly - 2 sets - 10 reps -  Supine Butterfly Groin Stretch  - 1 x daily - 7 x weekly - 2 sets - hold - Supine Single Knee to Chest Stretch  - 1 x daily - 7 x weekly - 2 sets - hold - Supine Figure 4 Piriformis Stretch  - 1 x daily - 7 x weekly - 2 sets - hold - Supine Lower Trunk Rotation  - 1 x daily - 7 x weekly - 2 sets - 10 reps  ASSESSMENT:  CLINICAL IMPRESSION: Patient is a 63 y.o. female  who was seen today for physical therapy treatment for urinary incontinence. Patient reports minimal leakage and she has still been trying to establish a consistent pelvic floor routine at home. Downtraining stretches introduced today to promote pelvic floor relaxation after doing her strength training exercises. Overall, pt tolerated session well and Pt would benefit from additional PT to further address deficits.    OBJECTIVE IMPAIRMENTS: decreased coordination, decreased endurance, decreased mobility, decreased ROM, and decreased strength.   ACTIVITY LIMITATIONS: continence  PARTICIPATION LIMITATIONS: community activity  PERSONAL FACTORS: Age, Past/current experiences, and Time since onset of injury/illness/exacerbation are also affecting patient's functional outcome.   REHAB POTENTIAL: Good  CLINICAL DECISION MAKING: Stable/uncomplicated  EVALUATION COMPLEXITY: Low   GOALS: Goals reviewed with patient? Yes  SHORT TERM GOALS: Target date: 09/14/2024  Pt will be independent with HEP.  Baseline: Goal status: INITIAL  2.  Pt will be independent with diaphragmatic breathing and down training activities in order to improve pelvic floor relaxation. Baseline:  Goal status: INITIAL  3.  Pt will be independent with the knack, urge suppression technique, and double voiding in order to improve bladder habits and decrease urinary incontinence.  Baseline:  Goal status: INITIAL  4.  Pt will be able to correctly perform diaphragmatic breathing and appropriate pressure management in order to prevent worsening  vaginal wall laxity and improve pelvic floor A/ROM.  Baseline:  Goal status: INITIAL  LONG TERM GOALS: Target date: 02/14/2025  Pt will be independent with advanced HEP.  Baseline:  Goal status: INITIAL  2.  Pt to demonstrate improved coordination of pelvic floor and breathing mechanics with 10# squat with appropriate synergistic patterns to decrease pain and leakage at least 75% of the time for improved ability to complete a 30 minute workout with strain at pelvic floor and symptoms.   Baseline:  Goal status: INITIAL  3.  Pt will have to use 0-1 pads per day to maintain dryness in underwear and to decrease the need for excessive pad use throughout the day. Baseline:  Goal status: INITIAL  4.  Pt will report no leaks with laughing, coughing, sneezing in order to improve comfort with interpersonal relationships and community activities.   Baseline:  Goal status: INITIAL  PLAN:  PT FREQUENCY: 1-2x/week  PT DURATION: 6 months  PLANNED INTERVENTIONS: 97110-Therapeutic exercises, 97530- Therapeutic activity, 97112- Neuromuscular re-education, 97535- Self Care, [id number]- Manual therapy, Patient/Family education, Taping, Joint mobilization, Spinal mobilization, Scar mobilization, Cryotherapy, and Moist heat  PLAN FOR NEXT SESSION: continued pelvic floor AROM training in standing, introduce deep core strengthening, stretching to lumbar and hips, discuss toileting mechanics and knack/urge drill   Celena JAYSON Domino, PT 09/08/2024, 11:38 AM

## 2024-09-27 ENCOUNTER — Other Ambulatory Visit (HOSPITAL_COMMUNITY)
Admission: RE | Admit: 2024-09-27 | Discharge: 2024-09-27 | Disposition: A | Discharge status: Home / Self Care | Source: Other Acute Inpatient Hospital | Attending: Obstetrics and Gynecology | Admitting: Obstetrics and Gynecology

## 2024-09-27 ENCOUNTER — Telehealth: Payer: Self-pay | Admitting: *Deleted

## 2024-09-27 ENCOUNTER — Ambulatory Visit

## 2024-09-27 VITALS — BP 138/89 | HR 66 | Temp 98.0°F

## 2024-09-27 DIAGNOSIS — R300 Dysuria: Secondary | ICD-10-CM

## 2024-09-27 DIAGNOSIS — R82998 Other abnormal findings in urine: Secondary | ICD-10-CM

## 2024-09-27 DIAGNOSIS — R319 Hematuria, unspecified: Secondary | ICD-10-CM

## 2024-09-27 LAB — URINALYSIS, COMPLETE (UACMP) WITH MICROSCOPIC
Bilirubin Urine: NEGATIVE
Glucose, UA: NEGATIVE mg/dL
Ketones, ur: NEGATIVE mg/dL
Nitrite: NEGATIVE
Protein, ur: 100 mg/dL — AB
RBC / HPF: >50 RBC/hpf (ref 0–5)
Specific Gravity, Urine: 1.021 (ref 1.005–1.030)
WBC, UA: >50 WBC/hpf (ref 0–5)
pH: 5 (ref 5.0–8.0)

## 2024-09-27 LAB — POCT URINALYSIS DIP (CLINITEK)
Bilirubin, UA: NEGATIVE
Glucose, UA: NEGATIVE mg/dL
Ketones, POC UA: NEGATIVE mg/dL
Nitrite, UA: NEGATIVE
POC PROTEIN,UA: 100 — AB
Spec Grav, UA: 1.025 (ref 1.010–1.025)
Urobilinogen, UA: 0.2 U/dL
pH, UA: 5.5 (ref 5.0–8.0)

## 2024-09-27 MED ORDER — NITROFURANTOIN MONOHYD MACRO 100 MG PO CAPS
100.0000 mg | ORAL_CAPSULE | Freq: Two times a day (BID) | ORAL | 0 refills | Status: AC
Start: 2024-09-27 — End: 2024-10-02

## 2024-09-27 NOTE — Progress Notes (Signed)
 Tamula arrived today with dysuria and urinary frequency. Patient is notexperiencing fever, unstable vitals and/or one-sided back flank pain. Patient has not had had a recent hospitalization due to UTI.  Last visit in the office was 09/27/2024.  Per protocol:   The most recent Urinalysis completed on 08-02-2024 and was not normal. See rersults in [facility] tab. Last Creatinine level No results found for: CREATININE  An urine specimen was collected and POCT urinalysis completed. [] A cath specimen was collected due to patient's current condition, symptoms or post-procedural state.  Total urine output by catheter is  Output by Drain (mL) 09/25/24 0701 - 09/25/24 1900 09/25/24 1901 - 09/26/24 0700 09/26/24 0701 - 09/26/24 1900 09/26/24 1901 - 09/27/24 0700 09/27/24 0701 - 09/27/24 1426  Requested LDAs do not have output data documented.    SABRA    POCT Urine results is not normal.  Urine micro was sent per protocol for abnormal urinalysis.  Urine culture was sent per protocol for abnormal urinalysis.     [x] Pt was notified of positive urine results and plan for additional urine testing. We will contact you within the next 3-4 days with these results.  [] No Prescription was sent to your pharmacy.  The additional testing will indicate if a prescription is needed.   [x] Patient was notified of abnormal urine results. The following prescription is sent to your preferred pharmacy.  [x]  Macrobid 100mg  #10 1 tablet by mouth twice daily with food for 5 days      []  Bactrim DS 800-160mg  #6 1 tablet by mouth twice daily for 3 days        []  Due to your current medication allergies, an alternate prescription was discussed with your provider and will be prescribed and sent to your pharmacy.  [x] You can take over the counter AZO two tablets up to three times a day for two days.  Take AZO tablets with a full glass of water . AZO will turn your urine orange, this is normal.   [] The patient was notified of negative  urine results.  If symptoms persist, you may take over the counter AZO two tablets up to three times a day for two days.  AZO will turn your urine orange, this is normal.  Contact the office back to schedule an appointment if your symptoms persist or worsen or you develop additional symptoms.       CC'd note to patient's provider.

## 2024-09-27 NOTE — Telephone Encounter (Signed)
 TC from pt DOB verified.  Pt c/o UTI sx's.  Advised nurse visit.  APt scheduled . KD

## 2024-09-29 ENCOUNTER — Ambulatory Visit: Payer: Self-pay | Admitting: Obstetrics and Gynecology

## 2024-09-29 ENCOUNTER — Ambulatory Visit: Admitting: Physical Therapy

## 2024-09-29 DIAGNOSIS — R319 Hematuria, unspecified: Secondary | ICD-10-CM

## 2024-09-29 LAB — URINE CULTURE: Culture: 50000 — AB

## 2024-10-04 ENCOUNTER — Encounter: Admitting: Physical Therapy

## 2024-10-05 ENCOUNTER — Ambulatory Visit: Admitting: Obstetrics and Gynecology

## 2024-10-07 ENCOUNTER — Ambulatory Visit: Payer: Self-pay | Admitting: Physical Therapy

## 2024-10-07 DIAGNOSIS — R279 Unspecified lack of coordination: Secondary | ICD-10-CM

## 2024-10-07 DIAGNOSIS — R293 Abnormal posture: Secondary | ICD-10-CM

## 2024-10-07 DIAGNOSIS — M6281 Muscle weakness (generalized): Secondary | ICD-10-CM

## 2024-10-07 NOTE — Therapy (Signed)
 OUTPATIENT PHYSICAL THERAPY FEMALE PELVIC TREATMENT/RECERT   Patient Name: Jodi Turner MRN: [id number] DOB:19-Oct-1961, 63 y.o., female Today's Date: 10/07/2024  END OF SESSION:  PT End of Session - 10/07/24 0948     Visit Number 5    Number of Visits 6    Date for Recertification  09/28/24    Authorization Type [employer]    PT Start Time 0930    PT Stop Time 1015    PT Time Calculation (min) 45 min    Activity Tolerance Patient tolerated treatment well    Behavior During Therapy [facility] for tasks assessed/performed             Past Medical History:  Diagnosis Date   Emphysema lung (HCC)    HR Chest CT 06-12-2023   GERD (gastroesophageal reflux disease)    Hiatal hernia    History of kidney stones    History of transurethral surgical removal of ureterocele 03/2014   s/p  right incisional removal ureterocele in setting obstructive right uteteral stone   Migraines    SUI (stress urinary incontinence, female)    Uterovaginal prolapse, incomplete    Wears contact lenses    Past Surgical History:  Procedure Laterality Date   CYSTO WITH HYDRODISTENSION N/A 04/06/2014   Procedure: CYSTOSCOPY/HYDRODISTENSION OF BLADDER;  Surgeon: Glendia DELENA Elizabeth, MD;  Location: [facility];  Service: Urology;  Laterality: N/A;   CYSTOSCOPY N/A 02/24/2024   Procedure: CYSTOSCOPY;  Surgeon: Marilynne Rosaline SAILOR, MD;  Location: [facility] OR;  Service: Gynecology;  Laterality: N/A;   CYSTOSCOPY W/ RETROGRADES Right 04/06/2014   Procedure: CYSTOSCOPY WITH RETROGRADE PYELOGRAM, INCISION OF RIGHT URETEROCELE, REMOVAL OF RIGHT DISTAL STONE;  Surgeon: Glendia DELENA Elizabeth, MD;  Location: [facility] [place];  Service: Urology;  Laterality: Right;   CYSTOSCOPY WITH URETHRAL DILATATION N/A 04/06/2014   Procedure: CYSTOSCOPY WITH URETHRAL DILATATION;  Surgeon: Glendia DELENA Elizabeth, MD;  Location: [facility] [place];  Service: Urology;  Laterality: N/A;   TUBAL  LIGATION Bilateral 1993   XI ROBOTIC ASSISTED TOTAL HYSTERECTOMY WITH SACROCOLPOPEXY N/A 02/24/2024   Procedure: XI ROBOTIC ASSISTED TOTAL HYSTERECTOMY WITH BILATERAL SALPINGO-OOPHORECTOMY AND SACROCOLPOPEXY;  Surgeon: Marilynne Rosaline SAILOR, MD;  Location: [facility] OR;  Service: Gynecology;  Laterality: N/A;  Total time requested is 3.5hrs.[facility]   Patient Active Problem List   Diagnosis Date Noted   Uterovaginal prolapse, incomplete 10/30/2023   Incomplete bladder emptying 10/30/2023   SUI (stress urinary incontinence, female) 10/30/2023   Smoker 03/16/2014   PCP: Cleotilde Planas, MD  REFERRING PROVIDER: Marilynne Rosaline SAILOR, MD  REFERRING DIAG: N39.3 (ICD-10-CM) - SUI (stress urinary incontinence, female)  THERAPY DIAG:  Muscle weakness (generalized)  Unspecified lack of coordination  Abnormal posture  Rationale for Evaluation and Treatment: Rehabilitation  ONSET DATE: unknown   SUBJECTIVE:  SUBJECTIVE STATEMENT: Patient reports that she is doing well. She reports that she has not been as consistent with HEP since she has been traveling. She plans to get back into walking and exercising tomorrow. Leakage is way better than it was, but she feels like she will still have an incident randomly. She doesn't feel like she needs a full pad more - but is not confident enough for the small liners. She saw her GI doctor yesterday.   From eval: Pt reports to PFPT with urinary leakage that started after she had children. For a long time, she was unable to do jumping jacks or run due to leakage. She's s/p Robotic assisted total laparoscopic hysterectomy with bilateral salpingo-oophorectomy, sacrocolpopexy Lucita Lite Y), cystoscopy on 02/24/24. She is now leaking again and wishes to get this under control. She has cut back  on the tea and feels a lot less urgency. Has been taking the myrbetriq  for about 6 weeks. Only has to wear a liner. Has had some SUI with lifting, cough/sneeze. This is only happening once a day, sometimes not every day.   Fluid intake: water  intake is low, enjoys tea   PAIN:  Are you having pain? No NPRS scale: 0/10  PRECAUTIONS: None  RED FLAGS: None   WEIGHT BEARING RESTRICTIONS: No  FALLS:  Has patient fallen in last 6 months? 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, stays busy   ACTIVITY LEVEL: Zumba on Tuesdays, Tai Chi on Wednesdays, wishes to get back into walking   PLOF: Independent  PATIENT GOALS: to not have to worry about peeing on self   BOWEL MOVEMENT: no issues  Pain with bowel movement: No Type of bowel movement:Type (Bristol Stool Scale) 4, Frequency within normal limits , Strain no, and Splinting no Fully empty rectum: Yes:   Leakage: No Pads: No Fiber supplement/laxative No  URINATION: Pain with urination: No Fully empty bladder: Yes: but unsure if she is as good at this as she used to be  Stream: Strong Urgency: Yes  Frequency: within normal limits - maybe 1x/night  Leakage: Walking to the bathroom, Coughing, Sneezing, Laughing, Exercise, and Lifting Pads: Yes: poise pads 2-3 per day   INTERCOURSE:  Ability to have vaginal penetration No  Pain with intercourse: none DrynessNo Climax: yes Marinoff Scale: 0/3  PREGNANCY: Vaginal deliveries 3 Tearing No Episiotomy Yes  C-section deliveries 0 Currently pregnant No  PROLAPSE: None  OBJECTIVE:  Note: Objective measures were completed at Evaluation unless otherwise noted.  PATIENT SURVEYS:  PFIQ-7: 24 PFIQ-7: 16 (10/07/24)  COGNITION: Overall cognitive status: Within functional limits for tasks assessed     SENSATION: Light touch: Appears intact  LUMBAR SPECIAL TESTS:  Single leg stance test: Positive  FUNCTIONAL TESTS:  Squat: bilateral dynamic knee valgus with  loading, general lumbopelvic stiffness with loading   GAIT: Assistive device utilized: None Comments: mild trendelenburg gait pattern with ambulation   POSTURE: rounded shoulders and forward head  LUMBARAROM/PROM: within normal limits for all motions bilaterally with no pain   LOWER EXTREMITY ROM: within normal limits for all motions bilaterally with no pain   LOWER EXTREMITY MMT: 4/5 bilateral knees and hips grossly with no pain   PALPATION:   General: no tenderness to palpation of bilateral adductors or hip flexors in supine   Pelvic Alignment: within normal limits   Abdominal: upper chest breathing, abdominal bracing, and decrease lower rib excursion with inhalation  External Perineal Exam: dryness present with limited clitoral hood mobility                              Internal Pelvic Floor: Pt fully consents to today's internal vaginal examination. She demonstrates low tone in bilateral aspects of superficial and deep pelvic floor musculature. She had no pain or palpable trigger points with today's exam. She has a moderate pelvic floor contraction that is lacking full range of motion. When paired with exhalation, her ability to contract the pelvic floor improved, along with her coordination. No pain following examination.  Patient confirms identification and approves PT to assess internal pelvic floor and treatment Yes No emotional/communication barriers or cognitive limitation. Patient is motivated to learn. Patient understands and agrees with treatment goals and plan. PT explains patient will be examined in standing, sitting, and lying down to see how their muscles and joints work. When they are ready, they will be asked to remove their underwear so PT can examine their perineum. The patient is also given the option of providing their own chaperone as one is not provided in our facility. The patient also has the right and is explained the right to defer or refuse any  part of the evaluation or treatment including the internal exam. With the patient's consent, PT will use one gloved finger to gently assess the muscles of the pelvic floor, seeing how well it contracts and relaxes and if there is muscle symmetry. After, the patient will get dressed and PT and patient will discuss exam findings and plan of care. PT and patient discuss plan of care, schedule, attendance policy and HEP activities.  PELVIC MMT:   MMT eval  Vaginal 3/5, 10 quick flicks, 10 second hold   Internal Anal Sphincter   External Anal Sphincter   Puborectalis   Diastasis Recti   (Blank rows = not tested)       TONE: Low in bilateral aspects of superficial and deep pelvic floor musculature   PROLAPSE: None present in hooklying with cough test   TODAY'S TREATMENT:                                                                                                                              DATE:   09/01/24: Urge drill for managing urge urinary incontinence  Knack drill for managing stress urinary incontinence  Review of seated pelvic floor quick flick contractions + diaphragmatic breathing  Education for patient on how to correctly perform a pelvic floor contraction without co-contraction from gluteals/adductors  Education for use of knack and urge drill with daily activities and tasks including  Sneezing / coughing/ laughing / lifting  When in the car and trying to get home with a high sense of urgency  When in the grocery store and trying to make it to the bathroom with a high sense of urgency   09/08/24: Standing pelvic  floor contraction + diaphragmatic breathing 2x10  Seated pelvic floor contraction + diaphragmatic breathing 2x10  Supine butterfly stretch + diaphragmatic breathing 2x64min  Single knee to chest stretch + diaphragmatic breathing 2x63min  Supine pretzel piriformis stretch + diaphragmatic breathing 2x29min  Lower trunk rotation + diaphragmatic breathing 2x59min Double  voiding technique to ensure optimal bladder emptying   10/07/24: Standing pelvic floor contraction + diaphragmatic breathing x38min  Seated pelvic floor contraction + diaphragmatic breathing x47min  Seated quick flick pelvic floor contraction + diaphragmatic breathing x20  Bridge + adductor ball squeeze + diaphragmatic breathing 2x10  Sit to stand + adductor ball squeeze + diaphragmatic breathing 2x10  Seated clamshell + GTB around knees + diaphragmatic breathing 2x10  Seated ball squeeze + internal rotation of the hip + diaphragmatic breathing 2x10  Supine butterfly stretch + diaphragmatic breathing 2x93min  Single knee to chest stretch + diaphragmatic breathing 2x58min  Supine pretzel piriformis stretch + diaphragmatic breathing 2x65min  Lower trunk rotation + diaphragmatic breathing 2x37min  PATIENT EDUCATION:  Education details: Relative anatomy and the connection between the diaphragm and pelvic floor, water  intake, bladder irritants, vaginal moisturizer samples provided  Person educated: Patient Education method: Explanation, Demonstration, Tactile cues, Verbal cues, and Handouts Education comprehension: verbalized understanding, returned demonstration, verbal cues required, tactile cues required, and needs further education  HOME EXERCISE PROGRAM: Access Code: [email or web address] URL: [email or web address] Date: 10/07/2024 Prepared by: Celena Domino  Exercises - Seated Pelvic Floor Contraction  - 1 x daily - 7 x weekly - 2 sets - 10 reps - Standing Pelvic Floor Contraction  - 1 x daily - 7 x weekly - 2 sets - 10 reps - Seated Quick Flick Pelvic Floor Contractions  - 1 x daily - 7 x weekly - 2 sets - 10 reps - Sit to Stand with Mercer Between Knees  - 1 x daily - 7 x weekly - 2 sets - 10 reps - Supine Bridge with Mini Swiss Ball Between Knees  - 1 x daily - 7 x weekly - 2 sets - 12 reps - Seated Hip Internal Rotation with Ball and Resistance  - 1 x daily - 7 x weekly - 2 sets - 10  reps - Seated Hip Abduction with Resistance  - 1 x daily - 7 x weekly - 2 sets - 10 reps - Supine Butterfly Groin Stretch  - 1 x daily - 7 x weekly - 2 sets - hold - Supine Single Knee to Chest Stretch  - 1 x daily - 7 x weekly - 2 sets - hold - Supine Figure 4 Piriformis Stretch  - 1 x daily - 7 x weekly - 2 sets - hold - Supine Lower Trunk Rotation  - 1 x daily - 7 x weekly - 2 sets - 10 reps  ASSESSMENT:  CLINICAL IMPRESSION: Patient is a 63 y.o. female  who was seen today for physical therapy treatment for urinary incontinence. She has been traveling and has been inconsistent with her HEP - we reviewed all previous exercises and introduced more resistance training to current regimen today. Downtraining stretches introduced today to promote pelvic floor relaxation after doing her strength training exercises. Overall, pt tolerated session well and Pt would benefit from additional PT to further address deficits.    OBJECTIVE IMPAIRMENTS: decreased coordination, decreased endurance, decreased mobility, decreased ROM, and decreased strength.   ACTIVITY LIMITATIONS: continence  PARTICIPATION LIMITATIONS: community activity  PERSONAL FACTORS: Age, Past/current experiences, and Time since onset of injury/illness/exacerbation  are also affecting patient's functional outcome.   REHAB POTENTIAL: Good  CLINICAL DECISION MAKING: Stable/uncomplicated  EVALUATION COMPLEXITY: Low   GOALS: Goals reviewed with patient? Yes  SHORT TERM GOALS: Target date: 09/14/2024  Pt will be independent with HEP.  Baseline: Goal status: GOAL MET  2.  Pt will be independent with diaphragmatic breathing and down training activities in order to improve pelvic floor relaxation. Baseline:  Goal status: GOAL MET  3.  Pt will be independent with the knack, urge suppression technique, and double voiding in order to improve bladder habits and decrease urinary incontinence.  Baseline:  Goal status:  GOAL MET  4.  Pt will be able to correctly perform diaphragmatic breathing and appropriate pressure management in order to prevent worsening vaginal wall laxity and improve pelvic floor A/ROM.  Baseline:  Goal status: GOAL MET  LONG TERM GOALS: Target date: 02/14/2025  Pt will be independent with advanced HEP.  Baseline:  Goal status: ONGOING  2.  Pt to demonstrate improved coordination of pelvic floor and breathing mechanics with 10# squat with appropriate synergistic patterns to decrease pain and leakage at least 75% of the time for improved ability to complete a 30 minute workout with strain at pelvic floor and symptoms.   Baseline:  Goal status: ONGOING  3.  Pt will have to use 0-1 pads per day to maintain dryness in underwear and to decrease the need for excessive pad use throughout the day. Baseline:  Goal status: ONGOING  4.  Pt will report no leaks with laughing, coughing, sneezing in order to improve comfort with interpersonal relationships and community activities.   Baseline:  Goal status: ONGOING  PLAN:  PT FREQUENCY: 1-2x/week  PT DURATION: 6 months  PLANNED INTERVENTIONS: 97110-Therapeutic exercises, 97530- Therapeutic activity, 97112- Neuromuscular re-education, 97535- Self Care, [id number]- Manual therapy, Patient/Family education, Taping, Joint mobilization, Spinal mobilization, Scar mobilization, Cryotherapy, and Moist heat  PLAN FOR NEXT SESSION: continued pelvic floor AROM training in standing, introduce deep core strengthening, stretching to lumbar and hips, discuss toileting mechanics and knack/urge drill   Celena JAYSON Domino, PT 10/07/2024, 9:49 AM

## 2024-10-09 MED ORDER — SULFAMETHOXAZOLE-TRIMETHOPRIM 800-160 MG PO TABS: 1.0000 | ORAL_TABLET | Freq: Two times a day (BID) | ORAL | 0 refills | Status: AC

## 2024-10-09 NOTE — Telephone Encounter (Signed)
 Patient called regarding persistent UTI symptoms after antibiotic treatment UA 09/27/24 + leuk/heme/protein, culture 50K E. Coli intermediate to cipro .  Rx macrobid 09/27/24 for 5 days. Reports doing well until this morning with urinary urgency, burning with urination, sensation of incomplete emptying, and hematuria. Start pyridium , denies NSAID or tylenol  use.  Denies fever, chills, N/V, one sided back pain.   Rx bactrim 1 tab twice a day for 3 days. Start Ibuprofen  600mg  and tylenol  500mg  every 6 hours for discomfort.  Advised pt to call if she continues to experience persistent or worsening urinary symptoms such as fever > 100.4, nausea/vomiting, one sided back pain or blood in urine.

## 2024-10-18 ENCOUNTER — Ambulatory Visit (INDEPENDENT_AMBULATORY_CARE_PROVIDER_SITE_OTHER): Admitting: Obstetrics and Gynecology

## 2024-10-18 VITALS — BP 132/87 | HR 68

## 2024-10-18 DIAGNOSIS — Z87440 Personal history of urinary (tract) infections: Secondary | ICD-10-CM | POA: Diagnosis not present

## 2024-10-18 DIAGNOSIS — N390 Urinary tract infection, site not specified: Secondary | ICD-10-CM

## 2024-10-18 DIAGNOSIS — N393 Stress incontinence (female) (male): Secondary | ICD-10-CM | POA: Diagnosis not present

## 2024-10-18 DIAGNOSIS — N3281 Overactive bladder: Secondary | ICD-10-CM

## 2024-10-18 MED ORDER — ESTRADIOL 0.01 % VA CREA: 0.5000 g | TOPICAL_CREAM | VAGINAL | 11 refills | Status: AC

## 2024-10-18 NOTE — Progress Notes (Unsigned)
 [facility] Return Visit  SUBJECTIVE  History of Present Illness: Jodi Turner is a 63 y.o. female seen in follow-up for mixed incontinence.  She s/p Robotic assisted total laparoscopic hysterectomy with bilateral salpingo-oophorectomy, sacrocolpopexy Lucita Lite Y), cystoscopy on 02/24/24.   She has been doing pelvic physical therapy. She is also on myrbetriq  50mg  for OAB. She did not feel the myrbetriq  made any difference. She changed from black tea to green tea and feels like she does not have spasms often anymore. Occasionally will have a thimble full of urine with bladder spasm, not every day. Maybe has some SUI with coughing or lifting, probabaly once a day when her bladder is full. She is down to a liner.   She has had 3 UTIs in the last few months (from labor day). Had been experiencing burning with urination.  She is not using vaginal estrogen. Last culture obtained with 09/27/24- 50,000 E.Coli. She was also treated over the weekend on 10/09/24 with macrobid.   Past Medical History: Patient  has a past medical history of Emphysema lung (HCC), GERD (gastroesophageal reflux disease), Hiatal hernia, History of kidney stones, History of transurethral surgical removal of ureterocele (03/2014), Migraines, SUI (stress urinary incontinence, female), Uterovaginal prolapse, incomplete, and Wears contact lenses.   Past Surgical History: She  has a past surgical history that includes Tubal ligation (Bilateral, 1993); cysto with hydrodistension (N/A, 04/06/2014); Cystoscopy with urethral dilatation (N/A, 04/06/2014); Cystoscopy w/ retrogrades (Right, 04/06/2014); Xi robotic assisted total hysterectomy with sacrocolpopexy (N/A, 02/24/2024); and Cystoscopy (N/A, 02/24/2024).   Medications: She has a current medication list which includes the following prescription(s): acetaminophen , alprazolam, aspirin-acetaminophen -caffeine, calcium carbonate, [START ON 10/20/2024] estradiol,  ibuprofen , mirabegron  er, omeprazole, polyethylene glycol powder, and rosuvastatin.   Allergies: Patient is allergic to augmentin [amoxicillin -pot clavulanate], hydrocodone, oxycodone, and tramadol.   Social History: Patient  reports that she quit smoking about 6 years ago. Her smoking use included cigarettes. She started smoking about 51 years ago. She has a 33.8 pack-year smoking history. She has never used smokeless tobacco. She reports current alcohol use. She reports that she does not use drugs.     OBJECTIVE     Physical Exam: Vitals:   10/18/24 1307  BP: 132/87  Pulse: 68    Gen: No apparent distress, A&O x 3.  Detailed Urogynecologic Evaluation:  Deferred.    ASSESSMENT AND PLAN    Jodi Turner is a 63 y.o. with:  1. Recurrent UTI   2. SUI (stress urinary incontinence, female)   3. Overactive bladder     - Overall she is happy with her progress with PT with the incontinence. She does not want any further procedures at this time.  - Recommended starting vaginal estrogen cream for prevention of urinary tract infections. Start nightly for two weeks then decrease to twice a week. She will notify us  if she has any further infection symptoms.   F/u 1 year or sooner if needed   Jodi [name], MD

## 2024-10-19 ENCOUNTER — Encounter: Payer: Self-pay | Admitting: Obstetrics and Gynecology

## 2024-10-27 ENCOUNTER — Encounter: Payer: Self-pay | Admitting: Obstetrics and Gynecology

## 2024-10-27 ENCOUNTER — Ambulatory Visit: Payer: Self-pay | Admitting: Obstetrics

## 2024-10-27 ENCOUNTER — Other Ambulatory Visit (HOSPITAL_COMMUNITY)
Admission: RE | Admit: 2024-10-27 | Discharge: 2024-10-27 | Disposition: A | Source: Other Acute Inpatient Hospital | Attending: Obstetrics and Gynecology | Admitting: Obstetrics and Gynecology

## 2024-10-27 ENCOUNTER — Ambulatory Visit (INDEPENDENT_AMBULATORY_CARE_PROVIDER_SITE_OTHER)

## 2024-10-27 VITALS — BP 133/79 | HR 61

## 2024-10-27 DIAGNOSIS — N390 Urinary tract infection, site not specified: Secondary | ICD-10-CM

## 2024-10-27 DIAGNOSIS — R350 Frequency of micturition: Secondary | ICD-10-CM

## 2024-10-27 DIAGNOSIS — R82998 Other abnormal findings in urine: Secondary | ICD-10-CM | POA: Insufficient documentation

## 2024-10-27 DIAGNOSIS — R319 Hematuria, unspecified: Secondary | ICD-10-CM

## 2024-10-27 LAB — URINALYSIS, COMPLETE (UACMP) WITH MICROSCOPIC
Bacteria, UA: NONE SEEN
Bilirubin Urine: NEGATIVE
Glucose, UA: NEGATIVE mg/dL
Ketones, ur: NEGATIVE mg/dL
Nitrite: NEGATIVE
Protein, ur: 30 mg/dL — AB
Specific Gravity, Urine: 1.031 — ABNORMAL HIGH (ref 1.005–1.030)
WBC, UA: >50 WBC/hpf (ref 0–5)
pH: 5 (ref 5.0–8.0)

## 2024-10-27 LAB — POCT URINALYSIS DIP (CLINITEK)
Bilirubin, UA: NEGATIVE
Glucose, UA: NEGATIVE mg/dL
Ketones, POC UA: NEGATIVE mg/dL
Nitrite, UA: NEGATIVE
POC PROTEIN,UA: 30 — AB
Spec Grav, UA: >=1.03 — AB (ref 1.010–1.025)
Urobilinogen, UA: 0.2 U/dL
pH, UA: 5.5 (ref 5.0–8.0)

## 2024-10-27 MED ORDER — SULFAMETHOXAZOLE-TRIMETHOPRIM 800-160 MG PO TABS: 1.0000 | ORAL_TABLET | Freq: Two times a day (BID) | ORAL | 0 refills | Status: AC

## 2024-10-27 NOTE — Patient Instructions (Signed)
Your Urine dip that was done in office was Positive. I am sending the urine off for culture and you can take AZO over the counter for your discomfort.  We will contact you when the results are back between 3-5 days. If a different antibiotic is needed we will sent the order to the pharmacy and you will be notified. If you have any questions or concerns please feel free to call us at [phone]

## 2024-10-27 NOTE — Progress Notes (Signed)
 Jodi Turner is a 63 y.o. female  arrived today with UTI sx.  Per Dr. Susen protocol: A urine specimen was collected and POCT Urine was done and urine culture sent to the lab. POCT Urine was POSITIVE for Leukocytes  Pt was notified and prescription sent to the preferred pharmacy.

## 2024-10-28 LAB — URINE CULTURE: Culture: <10000 — AB

## 2024-11-01 ENCOUNTER — Encounter: Payer: Self-pay | Admitting: Obstetrics and Gynecology

## 2024-11-08 ENCOUNTER — Ambulatory Visit: Payer: Self-pay | Attending: Obstetrics and Gynecology | Admitting: Physical Therapy

## 2024-11-08 DIAGNOSIS — M6281 Muscle weakness (generalized): Secondary | ICD-10-CM | POA: Diagnosis present

## 2024-11-08 NOTE — Patient Instructions (Signed)
 Bladder Irritants  Certain foods and beverages can be irritating to the bladder.  Avoiding these irritants may decrease your symptoms of urinary urgency, frequency or bladder pain.  Even reducing your intake can help with your symptoms.  Not everyone is sensitive to all bladder irritants, so you may consider focusing on one irritant at a time, removing or reducing your intake of that irritant for 7-10 days to see if this change helps your symptoms.  Water  intake is also very important.  Below is a list of bladder irritants.  Drinks: alcohol, carbonated beverages, caffeinated beverages such as coffee and tea, drinks with artificial sweeteners, citrus juices, apple juice, tomato juice  Foods: tomatoes and tomato based foods, spicy food, sugar and artificial sweeteners, vinegar, chocolate, raw onion, apples, citrus fruits, pineapple, cranberries, tomatoes, strawberries, plums, peaches, cantaloupe  Other: acidic urine (too concentrated) - see water  intake info below  Substitutes you can try that are NOT irritating to the bladder: cooked onion, pears, papayas, sun-brewed decaf teas, watermelons, non-citrus herbal teas, apricots, kava and low-acid instant drinks (Postum).  WATER  INTAKE: Remember to drink lots of water  (aim for fluid intake of half your body weight with 2/3 of fluids being water ).  You may be limiting fluids due to fear of leakage, but this can actually worsen urgency symptoms due to highly concentrated urine.  Water  helps balance the pH of your urine so it doesn't become too acidic - acidic urine is a bladder irritant!  Toileting Mechanics 101: Urination  Positioning: sit all the way down on the toilet, feet flat on the floor, trunk relaxed Hovering over the toilet seat can impact your ability to relax the pelvic floor musculature and empty your bladder well  When you initiate urination, try to let the urine flow out naturally rather than pushing down. Pushing can limit the ability of  your urethral sphincter to relax and open. Double voiding technique: When you think you are done urinating, try gently pushing to see if there is any remaining urine that can be expelled   You can try the following techniques to see if there is any remaining urine that can be expelled: Rocking back and forth Leaning side to side  Twisting your trunk  Taking deep belly breaths to promote blood flow to pelvic floor musculature  Defecation  Positioning: sit all the way down on the toilet, feet flat on the floor, trunk relaxed You can try using a squatty potty or toilet stool under your feet to change the angle of your rectum in your pelvic floor, making it easier to pass stool with less straining  Avoid straining or breath holding while on the toilet. This causes increased intra-abdominal pressure, which causes increased pressure down through the pelvic floor. We want to avoid this if possible. If you do feel the need to push to pass bowel movements, practice the following technique instead: Imagine you are fogging up a mirror with your breath as you exhale and gently push down and out through your pelvic floor. This relieves some pressure while you're still able to push the stool out.  You can try the following techniques to see if there is any remaining stool that can be expelled: Rocking back and forth Leaning side to side  Twisting your trunk  Taking deep belly breaths to promote blood flow to pelvic floor musculature   Urge Incontinence Ideal urination frequency is every 2-4 wakeful hours, which equates to 5-8 times within a 24-hour period.   Urge incontinence  is leakage that occurs when the bladder muscle contracts, creating a sudden need to go before getting to the bathroom.   Going too often when your bladder isn't actually full can disrupt the body's automatic signals to store and hold urine longer, which will increase urgency/frequency.  In this case, the bladder "is running the show"  and strategies can be learned to retrain this pattern.   One should be able to control the first urge to urinate, at around .  The bladder can hold up to a "grande latte," or . To help you gain control, practice the Urge Drill below when urgency strikes.  This drill will help retrain your bladder signals and allow you to store and hold urine longer.  The overall goal is to stretch out your time between voids to reach a more manageable voiding schedule.    Practice your quick flicks often throughout the day (each waking hour) even when you don't need feel the urge to go.  This will help strengthen your pelvic floor muscles, making them more effective in controlling leakage.  Urge Drill When you feel an urge to go, follow these steps to regain control: Stop what you are doing and be still Take one deep breath, directing your air into your abdomen Think an affirming thought, such as "I've got this." Do 5 quick flicks of your pelvic floor Walk with control to the bathroom to void, or delay voiding  THE KNACK The Knack is a strategy you may use to help to reduce or prevent leakage or passing of urine, gas or feces during an activity that causes downward force on the pelvic floor muscles.    Activities that can cause downward pressure on the pelvic floor muscles include coughing, sneezing, laughing, bending, lifting, and transitioning from different body positions such as from laying down to sitting up and sitting to standing.  To perform The Knack, consciously squeeze and lift your pelvic floor muscles to perform a strong, well-timed pelvic muscle contraction BEFORE AND DURING these activities above.  As your contraction gets more coordinated and your muscles get stronger, you will become more effective in controlling your experience of incontinence or gas passing during these activities.

## 2024-11-08 NOTE — Therapy (Signed)
 OUTPATIENT PHYSICAL THERAPY FEMALE PELVIC TREATMENT/DISCHARGE   Patient Name: Jodi Turner MRN: [id number] DOB:12-01-1961, 63 y.o., female Today's Date: 11/08/2024  END OF SESSION:  PT End of Session - 11/08/24 1137     Visit Number 6    Number of Visits 6    Date for Recertification  09/28/24    Authorization Type [employer]    PT Start Time 1100    PT Stop Time 1145    PT Time Calculation (min) 45 min    Activity Tolerance Patient tolerated treatment well    Behavior During Therapy [facility] for tasks assessed/performed              Past Medical History:  Diagnosis Date   Emphysema lung (HCC)    HR Chest CT 06-12-2023   GERD (gastroesophageal reflux disease)    Hiatal hernia    History of kidney stones    History of transurethral surgical removal of ureterocele 03/2014   s/p  right incisional removal ureterocele in setting obstructive right uteteral stone   Migraines    SUI (stress urinary incontinence, female)    Uterovaginal prolapse, incomplete    Wears contact lenses    Past Surgical History:  Procedure Laterality Date   CYSTO WITH HYDRODISTENSION N/A 04/06/2014   Procedure: CYSTOSCOPY/HYDRODISTENSION OF BLADDER;  Surgeon: Glendia DELENA Elizabeth, MD;  Location: [facility] [place];  Service: Urology;  Laterality: N/A;   CYSTOSCOPY N/A 02/24/2024   Procedure: CYSTOSCOPY;  Surgeon: Marilynne Rosaline SAILOR, MD;  Location: [facility] OR;  Service: Gynecology;  Laterality: N/A;   CYSTOSCOPY W/ RETROGRADES Right 04/06/2014   Procedure: CYSTOSCOPY WITH RETROGRADE PYELOGRAM, INCISION OF RIGHT URETEROCELE, REMOVAL OF RIGHT DISTAL STONE;  Surgeon: Glendia DELENA Elizabeth, MD;  Location: [facility] [place];  Service: Urology;  Laterality: Right;   CYSTOSCOPY WITH URETHRAL DILATATION N/A 04/06/2014   Procedure: CYSTOSCOPY WITH URETHRAL DILATATION;  Surgeon: Glendia DELENA Elizabeth, MD;  Location: [facility] [place];  Service: Urology;  Laterality: N/A;   TUBAL  LIGATION Bilateral 1993   XI ROBOTIC ASSISTED TOTAL HYSTERECTOMY WITH SACROCOLPOPEXY N/A 02/24/2024   Procedure: XI ROBOTIC ASSISTED TOTAL HYSTERECTOMY WITH BILATERAL SALPINGO-OOPHORECTOMY AND SACROCOLPOPEXY;  Surgeon: Marilynne Rosaline SAILOR, MD;  Location: [facility] OR;  Service: Gynecology;  Laterality: N/A;  Total time requested is 3.5hrs.[facility]   Patient Active Problem List   Diagnosis Date Noted   Uterovaginal prolapse, incomplete 10/30/2023   Incomplete bladder emptying 10/30/2023   SUI (stress urinary incontinence, female) 10/30/2023   Smoker 03/16/2014   PCP: Cleotilde Planas, MD  REFERRING PROVIDER: Marilynne Rosaline SAILOR, MD  REFERRING DIAG: N39.3 (ICD-10-CM) - SUI (stress urinary incontinence, female)  THERAPY DIAG:  Muscle weakness (generalized)  Rationale for Evaluation and Treatment: Rehabilitation  ONSET DATE: unknown   SUBJECTIVE:  SUBJECTIVE STATEMENT: Patient has had a UTI since last session. After this, she had a sinus flare that caused frequent sneezing and this happened again on Saturday. When she thinks about using the knack technique, it works for her. She does think the exercises are helping. She needs a refresh today, but she knows her PFPT exercises help and she is willing to be discharged today. No bowel issues to report.   From eval: Pt reports to PFPT with urinary leakage that started after she had children. For a long time, she was unable to do jumping jacks or run due to leakage. She's s/p Robotic assisted total laparoscopic hysterectomy with bilateral salpingo-oophorectomy, sacrocolpopexy Lucita Lite Y), cystoscopy on 02/24/24. She is now leaking again and wishes to get this under control. She has cut back on the tea and feels a lot less urgency. Has been taking the myrbetriq  for about 6  weeks. Only has to wear a liner. Has had some SUI with lifting, cough/sneeze. This is only happening once a day, sometimes not every day.   Fluid intake: water  intake is low, enjoys tea   PAIN:  Are you having pain? No NPRS scale: 0/10  PRECAUTIONS: None  RED FLAGS: None   WEIGHT BEARING RESTRICTIONS: No  FALLS:  Has patient fallen in last 6 months? 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, stays busy   ACTIVITY LEVEL: Zumba on Tuesdays, Tai Chi on Wednesdays, wishes to get back into walking   PLOF: Independent  PATIENT GOALS: to not have to worry about peeing on self   BOWEL MOVEMENT: no issues  Pain with bowel movement: No Type of bowel movement:Type (Bristol Stool Scale) 4, Frequency within normal limits , Strain no, and Splinting no Fully empty rectum: Yes:   Leakage: No Pads: No Fiber supplement/laxative No  URINATION: Pain with urination: No Fully empty bladder: Yes: but unsure if she is as good at this as she used to be  Stream: Strong Urgency: Yes  Frequency: within normal limits - maybe 1x/night  Leakage: Walking to the bathroom, Coughing, Sneezing, Laughing, Exercise, and Lifting Pads: Yes: poise pads 2-3 per day   INTERCOURSE:  Ability to have vaginal penetration No  Pain with intercourse: none DrynessNo Climax: yes Marinoff Scale: 0/3  PREGNANCY: Vaginal deliveries 3 Tearing No Episiotomy Yes  C-section deliveries 0 Currently pregnant No  PROLAPSE: None  OBJECTIVE:  Note: Objective measures were completed at Evaluation unless otherwise noted.  PATIENT SURVEYS:  PFIQ-7: 24 PFIQ-7: 16 (10/07/24)  COGNITION: Overall cognitive status: Within functional limits for tasks assessed     SENSATION: Light touch: Appears intact  LUMBAR SPECIAL TESTS:  Single leg stance test: Positive  FUNCTIONAL TESTS:  Squat: bilateral dynamic knee valgus with loading, general lumbopelvic stiffness with loading   GAIT: Assistive device  utilized: None Comments: mild trendelenburg gait pattern with ambulation   POSTURE: rounded shoulders and forward head  LUMBARAROM/PROM: within normal limits for all motions bilaterally with no pain   LOWER EXTREMITY ROM: within normal limits for all motions bilaterally with no pain   LOWER EXTREMITY MMT: 4/5 bilateral knees and hips grossly with no pain   PALPATION:   General: no tenderness to palpation of bilateral adductors or hip flexors in supine   Pelvic Alignment: within normal limits   Abdominal: upper chest breathing, abdominal bracing, and decrease lower rib excursion with inhalation                 External Perineal  Exam: dryness present with limited clitoral hood mobility                              Internal Pelvic Floor: Pt fully consents to today's internal vaginal examination. She demonstrates low tone in bilateral aspects of superficial and deep pelvic floor musculature. She had no pain or palpable trigger points with today's exam. She has a moderate pelvic floor contraction that is lacking full range of motion. When paired with exhalation, her ability to contract the pelvic floor improved, along with her coordination. No pain following examination.  Patient confirms identification and approves PT to assess internal pelvic floor and treatment Yes No emotional/communication barriers or cognitive limitation. Patient is motivated to learn. Patient understands and agrees with treatment goals and plan. PT explains patient will be examined in standing, sitting, and lying down to see how their muscles and joints work. When they are ready, they will be asked to remove their underwear so PT can examine their perineum. The patient is also given the option of providing their own chaperone as one is not provided in our facility. The patient also has the right and is explained the right to defer or refuse any part of the evaluation or treatment including the internal exam. With the  patient's consent, PT will use one gloved finger to gently assess the muscles of the pelvic floor, seeing how well it contracts and relaxes and if there is muscle symmetry. After, the patient will get dressed and PT and patient will discuss exam findings and plan of care. PT and patient discuss plan of care, schedule, attendance policy and HEP activities.  PELVIC MMT:   MMT eval  Vaginal 3/5, 10 quick flicks, 10 second hold   Internal Anal Sphincter   External Anal Sphincter   Puborectalis   Diastasis Recti   (Blank rows = not tested)       TONE: Low in bilateral aspects of superficial and deep pelvic floor musculature   PROLAPSE: None present in hooklying with cough test   TODAY'S TREATMENT:                                                                                                                              DATE:   09/01/24: Urge drill for managing urge urinary incontinence  Knack drill for managing stress urinary incontinence  Review of seated pelvic floor quick flick contractions + diaphragmatic breathing  Education for patient on how to correctly perform a pelvic floor contraction without co-contraction from gluteals/adductors  Education for use of knack and urge drill with daily activities and tasks including  Sneezing / coughing/ laughing / lifting  When in the car and trying to get home with a high sense of urgency  When in the grocery store and trying to make it to the bathroom with a high sense of urgency   09/08/24: Standing pelvic floor contraction +  diaphragmatic breathing 2x10  Seated pelvic floor contraction + diaphragmatic breathing 2x10  Supine butterfly stretch + diaphragmatic breathing 2x53min  Single knee to chest stretch + diaphragmatic breathing 2x91min  Supine pretzel piriformis stretch + diaphragmatic breathing 2x21min  Lower trunk rotation + diaphragmatic breathing 2x73min Double voiding technique to ensure optimal bladder emptying   10/07/24: Standing  pelvic floor contraction + diaphragmatic breathing x60min  Seated pelvic floor contraction + diaphragmatic breathing x79min  Seated quick flick pelvic floor contraction + diaphragmatic breathing x20  Bridge + adductor ball squeeze + diaphragmatic breathing 2x10  Sit to stand + adductor ball squeeze + diaphragmatic breathing 2x10  Seated clamshell + GTB around knees + diaphragmatic breathing 2x10  Seated ball squeeze + internal rotation of the hip + diaphragmatic breathing 2x10  Supine butterfly stretch + diaphragmatic breathing 2x24min  Single knee to chest stretch + diaphragmatic breathing 2x65min  Supine pretzel piriformis stretch + diaphragmatic breathing 2x25min  Lower trunk rotation + diaphragmatic breathing 2x36min  11/08/24: Review of: Bladder irritants  Knack drill for stress urinary incontinence  Urge drill for urge urinary incontinence  Toileting mechanics for optimal bladder and bowel emptying  Double voiding technique  Standing pelvic floor contraction + diaphragmatic breathing 2x10  Seated pelvic floor contraction + diaphragmatic breathing 2x10  Seated quick flick contractions + diaphragmatic breathing x20  Standing quick flick contractions + diaphragmatic breathing x20  Seated hip abduction (GTB) + diaphragmatic breathing x12  Sit to stand + GTB hip abduction + diaphragmatic breathing 2x10  Bridge + GTB hip abduction + diaphragmatic breathing 2x10  Seated ball squeeze + internal rotation of the hip + diaphragmatic breathing 2x10  Lower trunk rotations + diaphragmatic breathing x20  Single knee to chest stretch + diaphragmatic breathing each   PATIENT EDUCATION:  Education details: Relative anatomy and the connection between the diaphragm and pelvic floor, water  intake, bladder irritants, vaginal moisturizer samples provided  Person educated: Patient Education method: Explanation, Demonstration, Tactile cues, Verbal cues, and Handouts Education comprehension: verbalized  understanding, returned demonstration, verbal cues required, tactile cues required, and needs further education  HOME EXERCISE PROGRAM: Access Code: [email or web address] URL: [email or web address] Date: 11/08/2024 Prepared by: Celena Domino  Exercises - Seated Pelvic Floor Contraction  - 1 x daily - 7 x weekly - 2 sets - 10 reps - Standing Pelvic Floor Contraction  - 1 x daily - 7 x weekly - 2 sets - 10 reps - Seated Quick Flick Pelvic Floor Contractions  - 1 x daily - 7 x weekly - 2 sets - 10 reps - Sit to Stand with Resistance Around Legs  - 1 x daily - 7 x weekly - 2 sets - 10 reps - Supine Bridge with Resistance Band  - 1 x daily - 7 x weekly - 2 sets - 10 reps - Seated Hip Internal Rotation with Ball and Resistance  - 1 x daily - 7 x weekly - 2 sets - 10 reps - Seated Hip Abduction with Resistance  - 1 x daily - 7 x weekly - 2 sets - 10 reps - Supine Butterfly Groin Stretch  - 1 x daily - 7 x weekly - 2 sets - hold - Supine Single Knee to Chest Stretch  - 1 x daily - 7 x weekly - 2 sets - hold - Supine Figure 4 Piriformis Stretch  - 1 x daily - 7 x weekly - 2 sets - hold - Supine Lower Trunk Rotation  - 1 x daily -  7 x weekly - 2 sets - 10 reps  ASSESSMENT:  CLINICAL IMPRESSION: Patient is a 63 y.o. female  who was seen today for physical therapy treatment for urinary incontinence. She has been inconsistent with her HEP and has had a UTI/sickness since last session, feels like she needs a review of all pelvic floor training today. We reviewed HEP and all lifestyle recommendations for pelvic floor maintenance. She feels confident with management of urinary symptoms and is appropriate for discharge at this time.  OBJECTIVE IMPAIRMENTS: decreased coordination, decreased endurance, decreased mobility, decreased ROM, and decreased strength.   ACTIVITY LIMITATIONS: continence  PARTICIPATION LIMITATIONS: community activity  PERSONAL FACTORS: Age, Past/current experiences,  and Time since onset of injury/illness/exacerbation are also affecting patient's functional outcome.   REHAB POTENTIAL: Good  CLINICAL DECISION MAKING: Stable/uncomplicated  EVALUATION COMPLEXITY: Low   GOALS: Goals reviewed with patient? Yes  SHORT TERM GOALS: Target date: 09/14/2024  Pt will be independent with HEP.  Baseline: Goal status: GOAL MET  2.  Pt will be independent with diaphragmatic breathing and down training activities in order to improve pelvic floor relaxation. Baseline:  Goal status: GOAL MET  3.  Pt will be independent with the knack, urge suppression technique, and double voiding in order to improve bladder habits and decrease urinary incontinence.  Baseline:  Goal status: GOAL MET  4.  Pt will be able to correctly perform diaphragmatic breathing and appropriate pressure management in order to prevent worsening vaginal wall laxity and improve pelvic floor A/ROM.  Baseline:  Goal status: GOAL MET  LONG TERM GOALS: Target date: 02/14/2025  Pt will be independent with advanced HEP.  Baseline:  Goal status: GOAL MET 11/08/24  2.  Pt to demonstrate improved coordination of pelvic floor and breathing mechanics with 10# squat with appropriate synergistic patterns to decrease pain and leakage at least 75% of the time for improved ability to complete a 30 minute workout with strain at pelvic floor and symptoms.   Baseline:  Goal status: GOAL MET 11/08/24  3.  Pt will have to use 0-1 pads per day to maintain dryness in underwear and to decrease the need for excessive pad use throughout the day. Baseline:  Goal status: GOAL MET 11/08/24  4.  Pt will report no leaks with laughing, coughing, sneezing in order to improve comfort with interpersonal relationships and community activities.   Baseline:  Goal status: GOAL MET 11/08/24  PHYSICAL THERAPY DISCHARGE SUMMARY  Visits from Start of Care: 6  Current functional level related to goals / functional outcomes: See  above   Remaining deficits: See above   Education / Equipment: See above   Patient agrees to discharge. Patient goals were met. Patient is being discharged due to meeting the stated rehab goals.   Celena JAYSON Domino, PT 11/08/2024, 11:39 AM
